# Patient Record
Sex: Male | Born: 1990 | Race: White | Hispanic: No | Marital: Single | Smoking: Current every day smoker
Health system: Southern US, Community
[De-identification: ages and names within clinical notes are randomized; demographics above are authoritative.]

## PROBLEM LIST (undated history)

## (undated) DIAGNOSIS — F419 Anxiety disorder, unspecified: Secondary | ICD-10-CM

## (undated) DIAGNOSIS — F319 Bipolar disorder, unspecified: Secondary | ICD-10-CM

---
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
## 2019-04-02 ENCOUNTER — Emergency Department (HOSPITAL_COMMUNITY)
Admission: EM | Admit: 2019-04-02 | Discharge: 2019-04-02 | Disposition: A | Discharge status: Home / Self Care | Payer: Medicaid Other | Attending: Emergency Medicine | Admitting: Emergency Medicine

## 2019-04-02 ENCOUNTER — Encounter (HOSPITAL_COMMUNITY): Payer: Self-pay | Admitting: *Deleted

## 2019-04-02 ENCOUNTER — Other Ambulatory Visit: Payer: Self-pay

## 2019-04-02 DIAGNOSIS — M5431 Sciatica, right side: Secondary | ICD-10-CM | POA: Diagnosis not present

## 2019-04-02 DIAGNOSIS — M549 Dorsalgia, unspecified: Secondary | ICD-10-CM | POA: Diagnosis present

## 2019-04-02 MED ORDER — KETOROLAC TROMETHAMINE 30 MG/ML IJ SOLN
30.0000 mg | Freq: Once | INTRAMUSCULAR | Status: AC
  Administered 2019-04-02: 30 mg via INTRAMUSCULAR
  Filled 2019-04-02: qty 1

## 2019-04-02 NOTE — ED Provider Notes (Signed)
[facility] DEPT Provider Note   CSN: [id number] Arrival date & time: 04/02/19  0231     History   Chief Complaint Chief Complaint  Patient presents with  . Back Pain    HPI [name] [name] is a 28 y.o. male.     Patient presents to the emergency department with a chief complaint of right-sided low back pain.  He has a history of sciatica.  States that the pain radiates to his right leg.  Denies numbness or tingling.  Denies bowel or bladder incontinence.  Denies any fevers chills.  States that he thinks he exacerbated the symptoms by lifting tonight at work.  The history is provided by the patient. No language interpreter was used.    History reviewed. No pertinent past medical history.  There are no active problems to display for this patient.   History reviewed. No pertinent surgical history.      Home Medications    Prior to Admission medications   Not on File    Family History No family history on file.  Social History Social History   Tobacco Use  . Smoking status: Not on file  Substance Use Topics  . Alcohol use: Not on file  . Drug use: Not on file     Allergies   Patient has no known allergies.   Review of Systems Review of Systems  All other systems reviewed and are negative.    Physical Exam Updated Vital Signs BP (!) 155/94 (BP Location: Left Arm)   Pulse 89   Temp 98.2 F (36.8 C) (Oral)   Resp 17   Ht 6\' 1"  (1.854 m)   Wt 122.5 kg   SpO2 96%   BMI 35.62 kg/m   Physical Exam Physical Exam  Constitutional: Pt appears well-developed and well-nourished. No distress.  HENT:  Head: Normocephalic and atraumatic.  Mouth/Throat: Oropharynx is clear and moist. No oropharyngeal exudate.  Eyes: Conjunctivae are normal.  Neck: Normal range of motion. Neck supple.  No meningismus Cardiovascular: Normal rate, regular rhythm and intact distal pulses.   Pulmonary/Chest: Effort normal and breath sounds  normal. No respiratory distress. Pt has no wheezes.  Abdominal: Pt exhibits no distension Musculoskeletal:   Right lumbar paraspinal muscles tender to palpation, no bony CTLS spine tenderness, deformity, step-off, or crepitus Lymphadenopathy: Pt has no cervical adenopathy.  Neurological: Pt is alert and oriented Speech is clear and goal oriented, follows commands Normal 5/5 strength in lower extremities bilaterally including dorsiflexion and plantar flexion Sensation intact Great toe extension intact Moves extremities without ataxia, coordination intact Ankle and knee jerk reflexes intact and symmetrical  Normal gait Normal balance No Clonus Skin: Skin is warm and dry. No rash noted. Pt is not diaphoretic. No erythema.  Psychiatric: Pt has a normal mood and affect. Behavior is normal.  Nursing note and vitals reviewed.   ED Treatments / Results  Labs (all labs ordered are listed, but only abnormal results are displayed) Labs Reviewed - No data to display  EKG None  Radiology No results found.  Procedures Procedures (including critical care time)  Medications Ordered in ED Medications  ketorolac (TORADOL) 30 MG/ML injection 30 mg (has no administration in time range)     Initial Impression / Assessment and Plan / ED Course  I have reviewed the triage vital signs and the nursing notes.  Pertinent labs & imaging results that were available during my care of the patient were reviewed by me and considered in my  medical decision making (see chart for details).        Patient with back pain.    No neurological deficits and normal neuro exam.  Patient is ambulatory.  No loss of bowel or bladder control.  Doubt cauda equina.  Denies fever,  doubt epidural abscess or other lesion. Recommend back exercises, stretching, RICE, and will treat with toradol IM.  Consultants: none    Encouraged the patient that there could be a need for additional workup and/or imaging such as  MRI, if the symptoms do not resolve. Patient advised that if the back pain does not resolve, or radiates, this could progress to more serious conditions and is encouraged to follow-up with PCP or orthopedics within 2 weeks.     Final Clinical Impressions(s) / ED Diagnoses   Final diagnoses:  Sciatica of right side    ED Discharge Orders    None       Roxy Horseman, PA-C 04/02/19 0257    Palumbo, April, MD 04/02/19 2993

## 2019-04-02 NOTE — ED Triage Notes (Signed)
Right lower back pain for about 30 minutes, feel like sciatica. Pain radiates down leg.

## 2019-04-03 ENCOUNTER — Encounter (HOSPITAL_COMMUNITY): Payer: Self-pay

## 2019-04-03 ENCOUNTER — Emergency Department (HOSPITAL_COMMUNITY)
Admission: EM | Admit: 2019-04-03 | Discharge: 2019-04-03 | Disposition: A | Discharge status: Home / Self Care | Payer: Medicaid Other | Attending: Emergency Medicine | Admitting: Emergency Medicine

## 2019-04-03 ENCOUNTER — Other Ambulatory Visit: Payer: Self-pay

## 2019-04-03 DIAGNOSIS — M5441 Lumbago with sciatica, right side: Secondary | ICD-10-CM | POA: Insufficient documentation

## 2019-04-03 DIAGNOSIS — M545 Low back pain: Secondary | ICD-10-CM | POA: Diagnosis present

## 2019-04-03 MED ORDER — NAPROXEN 500 MG PO TABS: 500.0000 mg | ORAL_TABLET | Freq: Two times a day (BID) | ORAL | 0 refills | Status: DC

## 2019-04-03 NOTE — ED Provider Notes (Signed)
[facility] DEPT Provider Note   CSN: [id number] Arrival date & time: 04/03/19  1327     History   Chief Complaint Chief Complaint  Patient presents with  . Back Pain    HPI [name] [name] is a 28 y.o. male.     Patient presents the emergency department for ongoing back pain.  Patient was seen in the emergency department yesterday for the same symptoms.  Initially started several days ago after lifting a heavy box.  He was given a shot of Toradol in the ED.  Patient is currently in a rehab facility for substance abuse.  He has been on gabapentin in the past but is not currently allowed to take this for his sciatica.  Patient today is mainly requesting a note for work.  He would like to do warm Epson salt soaks tonight but needs a note or else he needs to go to work. Patient denies warning symptoms of back pain including: fecal incontinence, urinary retention or overflow incontinence, night sweats, waking from sleep with back pain, unexplained fevers or weight loss, h/o cancer, recent IVDU, recent trauma.  No weakness, numbness, tingling in lower extremities.  Not currently taking any medications for this.      History reviewed. No pertinent past medical history.  There are no active problems to display for this patient.   History reviewed. No pertinent surgical history.      Home Medications    Prior to Admission medications   Medication Sig Start Date End Date Taking? Authorizing Provider  naproxen (NAPROSYN) 500 MG tablet Take 1 tablet (500 mg total) by mouth 2 (two) times daily. 04/03/19   Carlisle Cater, PA-C    Family History History reviewed. No pertinent family history.  Social History Social History   Tobacco Use  . Smoking status: Not on file  Substance Use Topics  . Alcohol use: Not on file  . Drug use: Not on file     Allergies   Patient has no known allergies.   Review of Systems Review of Systems  Constitutional:  Negative for fever and unexpected weight change.  Gastrointestinal: Negative for constipation.       Neg for fecal incontinence  Genitourinary: Negative for difficulty urinating, flank pain and hematuria.       Negative for urinary incontinence or retention  Musculoskeletal: Positive for back pain.  Neurological: Negative for weakness and numbness.       Negative for saddle paresthesias      Physical Exam Updated Vital Signs BP (!) 154/89 (BP Location: Right Arm)   Pulse 77   Temp 98.9 F (37.2 C) (Oral)   Resp 16   Ht 6\' 1"  (1.854 m)   Wt 122 kg   SpO2 98%   BMI 35.49 kg/m   Physical Exam Vitals signs and nursing note reviewed.  Constitutional:      Appearance: He is well-developed.  HENT:     Head: Normocephalic and atraumatic.  Eyes:     Conjunctiva/sclera: Conjunctivae normal.  Neck:     Musculoskeletal: Normal range of motion.  Abdominal:     Palpations: Abdomen is soft.     Tenderness: There is no abdominal tenderness.  Musculoskeletal: Normal range of motion.     Lumbar back: He exhibits tenderness. He exhibits normal range of motion and no bony tenderness.       Back:     Comments: No step-off noted with palpation of spine.   Skin:  General: Skin is warm and dry.  Neurological:     Mental Status: He is alert.     Sensory: No sensory deficit.     Motor: No abnormal muscle tone.     Deep Tendon Reflexes: Reflexes are normal and symmetric.     Comments: 5/5 strength in entire lower extremities bilaterally. No sensation deficit.       ED Treatments / Results  Labs (all labs ordered are listed, but only abnormal results are displayed) Labs Reviewed - No data to display  EKG None  Radiology No results found.  Procedures Procedures (including critical care time)  Medications Ordered in ED Medications - No data to display   Initial Impression / Assessment and Plan / ED Course  I have reviewed the triage vital signs and the nursing notes.   Pertinent labs & imaging results that were available during my care of the patient were reviewed by me and considered in my medical decision making (see chart for details).        Patient seen and examined. Rx for naproxen. Pt given work note to rest tonight.   Vital signs reviewed and are as follows: BP (!) 154/89 (BP Location: Right Arm)   Pulse 77   Temp 98.9 F (37.2 C) (Oral)   Resp 16   Ht 6\' 1"  (1.854 m)   Wt 122 kg   SpO2 98%   BMI 35.49 kg/m   No red flag s/s of low back pain. Patient was counseled on back pain precautions and told to do activity as tolerated but do not lift, push, or pull heavy objects more than 10 pounds for the next week.  Patient counseled to use ice or heat on back for no longer than 15 minutes every hour.   Patient urged to follow-up with PCP if pain does not improve with treatment and rest or if pain becomes recurrent. Urged to return with worsening severe pain, loss of bowel or bladder control, trouble walking.   The patient verbalizes understanding and agrees with the plan.   Final Clinical Impressions(s) / ED Diagnoses   Final diagnoses:  Acute right-sided low back pain with right-sided sciatica   Patient with back pain with radicular features, similar to previous episodes. No neurological deficits. Patient is ambulatory. No warning symptoms of back pain including: fecal incontinence, urinary retention or overflow incontinence, night sweats, waking from sleep with back pain, unexplained fevers or weight loss, h/o cancer, IVDU, recent trauma. No concern for cauda equina, epidural abscess, or other serious cause of back pain. Conservative measures such as rest, ice/heat and pain medicine indicated with PCP follow-up if no improvement with conservative management.    ED Discharge Orders         Ordered    naproxen (NAPROSYN) 500 MG tablet  2 times daily     04/03/19 1401           Jereme, Loren, PA-C 04/03/19 1407    13/06/20, MD 04/03/19 1654

## 2019-04-03 NOTE — Discharge Instructions (Signed)
Please read and follow all provided instructions.  Your diagnoses today include:  1. Acute right-sided low back pain with right-sided sciatica     Tests performed today include:  Vital signs - see below for your results today  Medications prescribed:   Naproxen - anti-inflammatory pain medication  Do not exceed 500mg  naproxen every 12 hours, take with food  You have been prescribed an anti-inflammatory medication or NSAID. Take with food. Take smallest effective dose for the shortest duration needed for your pain. Stop taking if you experience stomach pain or vomiting.   Take any prescribed medications only as directed.  Home care instructions:   Follow any educational materials contained in this packet  Please rest, use ice or heat on your back for the next several days  Do not lift, push, pull anything more than 10 pounds for the next week  Follow-up instructions: Please follow-up with your primary care provider in the next 1 week for further evaluation of your symptoms.   Return instructions:  SEEK IMMEDIATE MEDICAL ATTENTION IF YOU HAVE:  New numbness, tingling, weakness, or problem with the use of your arms or legs  Severe back pain not relieved with medications  Loss control of your bowels or bladder  Increasing pain in any areas of the body (such as chest or abdominal pain)  Shortness of breath, dizziness, or fainting.   Worsening nausea (feeling sick to your stomach), vomiting, fever, or sweats  Any other emergent concerns regarding your health   Additional Information:  Your vital signs today were: BP (!) 154/89 (BP Location: Right Arm)    Pulse 77    Temp 98.9 F (37.2 C) (Oral)    Resp 16    Ht 6\' 1"  (1.854 m)    Wt 122 kg    SpO2 98%    BMI 35.49 kg/m  If your blood pressure (BP) was elevated above 135/85 this visit, please have this repeated by your doctor within one month. --------------

## 2019-04-03 NOTE — ED Triage Notes (Signed)
Pt reports sciatica back pain that radiates to right leg. Pt states that he is going to a rehab facility for cocaine and they will not allow him to take his normal prescription of gabapentin.

## 2019-04-17 ENCOUNTER — Other Ambulatory Visit: Payer: Self-pay | Admitting: Family Medicine

## 2019-04-17 DIAGNOSIS — M545 Low back pain, unspecified: Secondary | ICD-10-CM

## 2019-04-21 ENCOUNTER — Other Ambulatory Visit: Payer: Self-pay

## 2019-04-21 ENCOUNTER — Ambulatory Visit: Payer: Medicaid Other | Attending: Family Medicine | Admitting: Physical Therapy

## 2019-04-21 ENCOUNTER — Encounter: Payer: Self-pay | Admitting: Physical Therapy

## 2019-04-21 DIAGNOSIS — M79651 Pain in right thigh: Secondary | ICD-10-CM

## 2019-04-21 DIAGNOSIS — M5416 Radiculopathy, lumbar region: Secondary | ICD-10-CM | POA: Insufficient documentation

## 2019-04-21 NOTE — Therapy (Signed)
[facility] [address] [place], [place], [address] Phone: [phone]   Fax:  [phone]  Physical Therapy Evaluation  Patient Details  Name: [name] [name] MRN: [id number] Date of Birth: 10-17-1990 Referring Provider (PT): Otho Darner, MD   Encounter Date: 04/21/2019  PT End of Session - 04/21/19 1147    Visit Number  1    Date for PT Re-Evaluation  05/15/19    Authorization Type  MCD- auth submitted 11/24    PT Start Time  1100    PT Stop Time  1136    PT Time Calculation (min)  36 min    Activity Tolerance  Patient limited by pain    Behavior During Therapy  [facility] for tasks assessed/performed       History reviewed. No pertinent past medical history.  History reviewed. No pertinent surgical history.  There were no vitals filed for this visit.   Subjective Assessment - 04/21/19 1104    Subjective  about 3 years ago I think I lifted something wrong & pain has gradually worsened. [place] work [place] and it is a lot of heavy lifting. Sharp pain that limits walking. Starts in back and goes down Rt leg. Denies regular exercise.    How long can you stand comfortably?  a couple of hours    Currently in Pain?  Yes    Pain Score  7     Pain Location  Back    Pain Orientation  Right    Pain Descriptors / Indicators  Sharp    Pain Radiating Towards  Rt Le    Aggravating Factors   standing    Pain Relieving Factors  sit down         [facility] PT Assessment - 04/21/19 0001      Assessment   Medical Diagnosis  LBP    Referring Provider (PT)  Otho Darner, MD    Onset Date/Surgical Date  --   3 yr ago   Hand Dominance  Right      Precautions   Precautions  None      Restrictions   Weight Bearing Restrictions  No      Balance Screen   Has the patient fallen in the past 6 months  No      Home Environment   Living Environment  Private residence    Additional Comments  a couple of steps      Prior  Function   Level of Independence  Independent    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      [facility] Adult PT Treatment/Exercise - 04/21/19 0001  Exercises   Exercises  Knee/Hip      Knee/Hip Exercises: Stretches   Passive Hamstring Stretch Limitations  seated EOB      Manual Therapy   Manual Therapy  Soft tissue mobilization    Manual therapy comments  LLE LAD, edu on self STM with tennis ball    Soft tissue mobilization  trigger point release Rt QL & glut max             PT Education - 04/21/19 1709    Education Details  anatomy of condition, POC, HEP, exercise form/raitonale    Person(s) Educated  Patient    Methods  Explanation;Demonstration;Tactile cues;Verbal cues;Handout    Comprehension  Verbalized understanding;Returned demonstration;Verbal cues required;Need further instruction;Tactile cues required       PT Short Term Goals - 04/21/19 1714      PT SHORT TERM GOAL #1   Title  pt will perform bed mobility wiht <=moderate pain    Baseline  severe at eval    Time  3    Period  Weeks    Status  New    Target Date  05/15/19      PT SHORT TERM GOAL #2   Title  pt will demo at least 25% available lumbar AROM    Baseline  approx 10% at eval    Time  3    Period  Weeks    Status  New    Target Date  05/15/19      PT SHORT TERM GOAL #3   Title  pt will  be able to demo proper seated posture    Baseline  slouching at eval due to pain    Time  3    Period  Weeks    Status  New    Target Date  05/15/19        PT Long Term Goals - 04/21/19 1720      PT LONG TERM GOAL #1   Title  to be set at ERO PRN             Plan - 04/21/19 1156    Clinical Impression Statement  Pt presents to PT with complaints of Rt low back and Rt leg pain that began about 3 years ago. Pt unable to place weight through RLE in standing. In supine, approx 3" LLD (Lt shorder) apparent and decreased to approx 1/2" apparent discrepancy following LAD. pt has a very difficult time moving so exercises were difficult. He also sits in a sacral-sitting position and was cued to sit upright but reported increased pain. concordant pain in LE created with trigger point palpation so we discussed likely use of TPDN at next visit. Pt will benefit from skilled PT in order to reduce muscle spasm to allow for proper alignment and mechanical chain movement.    Personal Factors and Comorbidities  Comorbidity 1;Fitness    Comorbidities  job requiring heavy lifting    Examination-Activity Limitations  Bathing;Locomotion Level;Transfers;Bed Mobility;Bend;Sit;Carry;Sleep;Squat;Dressing;Stairs;Stand;Lift    Examination-Participation Restrictions  Meal Prep;Cleaning    Stability/Clinical Decision Making  Evolving/Moderate complexity    Clinical Decision Making  Moderate    Rehab Potential  Good    PT Duration  --   3 visits in first auth   PT Treatment/Interventions  ADLs/Self Care Home Management;Cryotherapy;Electrical Stimulation;Ultrasound;Traction;Moist Heat;Iontophoresis 4mg /ml Dexamethasone;Gait training;Stair training;Functional mobility training;Therapeutic activities;Therapeutic exercise;Balance training;Patient/family education;Neuromuscular re-education;Manual techniques;Passive range of motion;Dry needling;Spinal Manipulations;Taping;Joint Manipulations    PT Next Visit Plan   continue LAD, TPDN  PT Home Exercise Plan  LTR, piriformis stretch, seated HSS    Consulted and Agree with Plan of Care  Patient       Patient will benefit from skilled therapeutic intervention in order to improve the following deficits and impairments:  Difficulty walking, Increased muscle spasms, Decreased activity tolerance, Pain, Improper body mechanics, Impaired flexibility, Decreased strength, Decreased mobility, Postural dysfunction  Visit Diagnosis: Radiculopathy, lumbar region - Plan: PT plan of care cert/re-cert  Pain of right thigh - Plan: PT plan of care cert/re-cert     Problem List There are no active problems to display for this patient.   Betha Shadix C. Seema Blum PT, DPT 04/21/19 5:23 PM   [facility] [address] [place], [place], [address] Phone: [phone]   Fax:  [phone]  Name: Careem Yasui MRN: [id number] Date of Birth: [birth year]

## 2019-04-26 ENCOUNTER — Encounter (HOSPITAL_COMMUNITY): Payer: Self-pay

## 2019-04-26 ENCOUNTER — Inpatient Hospital Stay (HOSPITAL_COMMUNITY)
Admission: AD | Admit: 2019-04-26 | Discharge: 2019-05-01 | DRG: 885 | Disposition: A | Discharge status: Home / Self Care | Payer: Medicaid Other | Source: Intra-hospital | Attending: Psychiatry | Admitting: Psychiatry

## 2019-04-26 ENCOUNTER — Other Ambulatory Visit: Payer: Self-pay

## 2019-04-26 ENCOUNTER — Emergency Department (HOSPITAL_COMMUNITY)
Admission: EM | Admit: 2019-04-26 | Discharge: 2019-04-26 | Disposition: A | Discharge status: Other Acute Inpatient Hospital | Payer: Medicaid Other | Attending: Emergency Medicine | Admitting: Emergency Medicine

## 2019-04-26 DIAGNOSIS — F17210 Nicotine dependence, cigarettes, uncomplicated: Secondary | ICD-10-CM | POA: Diagnosis present

## 2019-04-26 DIAGNOSIS — Z79899 Other long term (current) drug therapy: Secondary | ICD-10-CM | POA: Insufficient documentation

## 2019-04-26 DIAGNOSIS — F319 Bipolar disorder, unspecified: Secondary | ICD-10-CM | POA: Diagnosis present

## 2019-04-26 DIAGNOSIS — F332 Major depressive disorder, recurrent severe without psychotic features: Secondary | ICD-10-CM | POA: Diagnosis not present

## 2019-04-26 DIAGNOSIS — F3181 Bipolar II disorder: Secondary | ICD-10-CM

## 2019-04-26 DIAGNOSIS — F1220 Cannabis dependence, uncomplicated: Secondary | ICD-10-CM | POA: Diagnosis present

## 2019-04-26 DIAGNOSIS — F1994 Other psychoactive substance use, unspecified with psychoactive substance-induced mood disorder: Secondary | ICD-10-CM | POA: Diagnosis present

## 2019-04-26 DIAGNOSIS — R45851 Suicidal ideations: Secondary | ICD-10-CM | POA: Insufficient documentation

## 2019-04-26 DIAGNOSIS — Z008 Encounter for other general examination: Secondary | ICD-10-CM | POA: Diagnosis not present

## 2019-04-26 DIAGNOSIS — Z9119 Patient's noncompliance with other medical treatment and regimen: Secondary | ICD-10-CM

## 2019-04-26 DIAGNOSIS — F329 Major depressive disorder, single episode, unspecified: Secondary | ICD-10-CM | POA: Diagnosis present

## 2019-04-26 DIAGNOSIS — Z20828 Contact with and (suspected) exposure to other viral communicable diseases: Secondary | ICD-10-CM | POA: Diagnosis not present

## 2019-04-26 LAB — COMPREHENSIVE METABOLIC PANEL
ALT: 13 U/L (ref 0–44)
AST: 17 U/L (ref 15–41)
Albumin: 3.9 g/dL (ref 3.5–5.0)
Alkaline Phosphatase: 43 U/L (ref 38–126)
Anion gap: 9 (ref 5–15)
BUN: 18 mg/dL (ref 6–20)
CO2: 24 mmol/L (ref 22–32)
Calcium: 8.9 mg/dL (ref 8.9–10.3)
Chloride: 105 mmol/L (ref 98–111)
Creatinine, Ser: 1.05 mg/dL (ref 0.61–1.24)
GFR calc Af Amer: >60 mL/min (ref >60)
GFR calc non Af Amer: >60 mL/min (ref >60)
Glucose, Bld: 141 mg/dL — ABNORMAL HIGH (ref 70–99)
Potassium: 3.6 mmol/L (ref 3.5–5.1)
Sodium: 138 mmol/L (ref 135–145)
Total Bilirubin: 0.4 mg/dL (ref 0.3–1.2)
Total Protein: 7.3 g/dL (ref 6.5–8.1)

## 2019-04-26 LAB — SALICYLATE LEVEL: Salicylate Lvl: <7 mg/dL (ref 2.8–30.0)

## 2019-04-26 LAB — RAPID URINE DRUG SCREEN, HOSP PERFORMED
Amphetamines: NOT DETECTED
Barbiturates: NOT DETECTED
Benzodiazepines: NOT DETECTED
Cocaine: NOT DETECTED
Opiates: NOT DETECTED
Tetrahydrocannabinol: POSITIVE — AB

## 2019-04-26 LAB — LITHIUM LEVEL: Lithium Lvl: 0.27 mmol/L — ABNORMAL LOW (ref 0.60–1.20)

## 2019-04-26 LAB — CBC
HCT: 45.3 % (ref 39.0–52.0)
Hemoglobin: 14.4 g/dL (ref 13.0–17.0)
MCH: 30.1 pg (ref 26.0–34.0)
MCHC: 31.8 g/dL (ref 30.0–36.0)
MCV: 94.6 fL (ref 80.0–100.0)
Platelets: 252 10*3/uL (ref 150–400)
RBC: 4.79 MIL/uL (ref 4.22–5.81)
RDW: 12.8 % (ref 11.5–15.5)
WBC: 16.8 10*3/uL — ABNORMAL HIGH (ref 4.0–10.5)
nRBC: 0 % (ref 0.0–0.2)

## 2019-04-26 LAB — ACETAMINOPHEN LEVEL: Acetaminophen (Tylenol), Serum: <10 ug/mL — ABNORMAL LOW (ref 10–30)

## 2019-04-26 LAB — ETHANOL: Alcohol, Ethyl (B): <10 mg/dL (ref <10)

## 2019-04-26 LAB — SARS CORONAVIRUS 2 BY RT PCR (HOSPITAL ORDER, PERFORMED IN ~~LOC~~ HOSPITAL LAB): SARS Coronavirus 2: NEGATIVE

## 2019-04-26 MED ORDER — NICOTINE 21 MG/24HR TD PT24
21.0000 mg | MEDICATED_PATCH | Freq: Once | TRANSDERMAL | Status: AC
  Administered 2019-04-26: 21 mg via TRANSDERMAL
  Filled 2019-04-26: qty 1

## 2019-04-26 MED ORDER — LITHIUM CARBONATE ER 300 MG PO TBCR
300.0000 mg | EXTENDED_RELEASE_TABLET | Freq: Two times a day (BID) | ORAL | Status: DC
  Administered 2019-04-26: 300 mg via ORAL
  Filled 2019-04-26: qty 1

## 2019-04-26 MED ORDER — LITHIUM CARBONATE 300 MG PO CAPS: 600.0000 mg | ORAL_CAPSULE | Freq: Two times a day (BID) | ORAL | Status: DC

## 2019-04-26 MED ORDER — TRAZODONE HCL 100 MG PO TABS
100.0000 mg | ORAL_TABLET | Freq: Every evening | ORAL | Status: DC | PRN
  Administered 2019-04-26: 100 mg via ORAL
  Filled 2019-04-26: qty 1

## 2019-04-26 MED ORDER — NICOTINE 21 MG/24HR TD PT24
21.0000 mg | MEDICATED_PATCH | Freq: Once | TRANSDERMAL | Status: DC
  Administered 2019-04-26: 21 mg via TRANSDERMAL
  Filled 2019-04-26: qty 1

## 2019-04-26 MED ORDER — NAPROXEN 500 MG PO TABS
500.0000 mg | ORAL_TABLET | Freq: Two times a day (BID) | ORAL | Status: DC
  Administered 2019-04-28: 500 mg via ORAL
  Filled 2019-04-26 (×8): qty 1

## 2019-04-26 MED ORDER — ACETAMINOPHEN 325 MG PO TABS
650.0000 mg | ORAL_TABLET | Freq: Four times a day (QID) | ORAL | Status: DC | PRN
  Administered 2019-04-28: 650 mg via ORAL
  Filled 2019-04-26: qty 2

## 2019-04-26 MED ORDER — LITHIUM CARBONATE 300 MG PO CAPS
750.0000 mg | ORAL_CAPSULE | Freq: Two times a day (BID) | ORAL | Status: DC
  Administered 2019-04-27: 750 mg via ORAL
  Filled 2019-04-26 (×4): qty 1

## 2019-04-26 MED ORDER — HYDROXYZINE HCL 25 MG PO TABS
25.0000 mg | ORAL_TABLET | Freq: Three times a day (TID) | ORAL | Status: DC | PRN
  Administered 2019-04-26 – 2019-04-27 (×3): 25 mg via ORAL
  Filled 2019-04-26 (×3): qty 1

## 2019-04-26 MED ORDER — MAGNESIUM HYDROXIDE 400 MG/5ML PO SUSP: 30.0000 mL | Freq: Every day | ORAL | Status: DC | PRN

## 2019-04-26 MED ORDER — LITHIUM CARBONATE 300 MG PO CAPS
750.0000 mg | ORAL_CAPSULE | Freq: Once | ORAL | Status: AC
  Administered 2019-04-26: 750 mg via ORAL
  Filled 2019-04-26: qty 1

## 2019-04-26 MED ORDER — ALUM & MAG HYDROXIDE-SIMETH 200-200-20 MG/5ML PO SUSP: 30.0000 mL | ORAL | Status: DC | PRN

## 2019-04-26 NOTE — ED Triage Notes (Signed)
Pt reports SI. He states that he was released from [facility] about a month ago. Hx of cocaine abuse. Denies a current plan, but states that he has cut himself in the past.

## 2019-04-26 NOTE — ED Notes (Signed)
Pt moved to a room for privacy, TTS consult in progress.

## 2019-04-26 NOTE — ED Notes (Signed)
Pt back to bed in [place] [facility].

## 2019-04-26 NOTE — ED Notes (Signed)
Patient has ambulated to restroom without complication or assistance from staff.

## 2019-04-26 NOTE — Progress Notes (Signed)
04/26/2019  [place] To give report. Left name and number for RN to return call. Waiting for RN to call back in 30 minutes.

## 2019-04-26 NOTE — ED Provider Notes (Signed)
[facility] DEPT Provider Note   CSN: [id number] Arrival date & time: 04/26/19  0005     History   Chief Complaint Chief Complaint  Patient presents with  . Medical Clearance    HPI [name] [name] is a 28 y.o. male.     Patient to ED with suicidal ideations and depression. History of same, last hospitalization one month ago at [facility]. No self harm prior to arrival. No recent fever, cough, congestion, vomiting, abdominal pain. History of substance abuse.  The history is provided by the patient. No language interpreter was used.    History reviewed. No pertinent past medical history.  There are no active problems to display for this patient.   History reviewed. No pertinent surgical history.      Home Medications    Prior to Admission medications   Medication Sig Start Date End Date Taking? Authorizing Provider  lithium 600 MG capsule Take 600 mg by mouth 2 (two) times daily with a meal.    [provider]  naproxen (NAPROSYN) 500 MG tablet Take 1 tablet (500 mg total) by mouth 2 (two) times daily. 04/03/19   Carlisle Cater, PA-C    Family History History reviewed. No pertinent family history.  Social History Social History   Tobacco Use  . Smoking status: Not on file  Substance Use Topics  . Alcohol use: Not on file  . Drug use: Not on file     Allergies   Patient has no known allergies.   Review of Systems Review of Systems  Constitutional: Negative for chills and fever.  HENT: Negative.   Respiratory: Negative.   Cardiovascular: Negative.   Gastrointestinal: Negative.   Musculoskeletal: Negative.   Skin: Negative.   Neurological: Negative.   Psychiatric/Behavioral: Positive for dysphoric mood and suicidal ideas. Negative for self-injury.     Physical Exam Updated Vital Signs BP (!) 145/89 (BP Location: Left Arm)   Pulse (!) 112   Temp 98.8 F (37.1 C) (Oral)   Resp (!) 22   SpO2 99%    Physical Exam Vitals signs and nursing note reviewed.  Constitutional:      Appearance: He is well-developed.  HENT:     Head: Normocephalic.  Neck:     Musculoskeletal: Normal range of motion and neck supple.  Cardiovascular:     Rate and Rhythm: Normal rate and regular rhythm.     Heart sounds: No murmur.  Pulmonary:     Effort: Pulmonary effort is normal.     Breath sounds: Normal breath sounds. No wheezing, rhonchi or rales.  Abdominal:     General: Bowel sounds are normal.     Palpations: Abdomen is soft.     Tenderness: There is no abdominal tenderness. There is no guarding or rebound.  Musculoskeletal: Normal range of motion.  Skin:    General: Skin is warm and dry.     Findings: No rash.  Neurological:     Mental Status: He is alert and oriented to person, place, and time.  Psychiatric:        Attention and Perception: He does not perceive auditory or visual hallucinations.        Mood and Affect: Mood is depressed.        Thought Content: Thought content includes suicidal ideation.      ED Treatments / Results  Labs (all labs ordered are listed, but only abnormal results are displayed) Labs Reviewed  COMPREHENSIVE METABOLIC PANEL - Abnormal; Notable for  the following components:      Result Value   Glucose, Bld 141 (*)    All other components within normal limits  ACETAMINOPHEN LEVEL - Abnormal; Notable for the following components:   Acetaminophen (Tylenol), Serum <10 (*)    All other components within normal limits  CBC - Abnormal; Notable for the following components:   WBC 16.8 (*)    All other components within normal limits  RAPID URINE DRUG SCREEN, HOSP PERFORMED - Abnormal; Notable for the following components:   Tetrahydrocannabinol POSITIVE (*)    All other components within normal limits  LITHIUM LEVEL - Abnormal; Notable for the following components:   Lithium Lvl 0.27 (*)    All other components within normal limits  ETHANOL  SALICYLATE  LEVEL    EKG None  Radiology No results found.  Procedures Procedures (including critical care time)  Medications Ordered in ED Medications - No data to display   Initial Impression / Assessment and Plan / ED Course  I have reviewed the triage vital signs and the nursing notes.  Pertinent labs & imaging results that were available during my care of the patient were reviewed by me and considered in my medical decision making (see chart for details).        Patient to ED with SI, no reported self injury PTA. History of same.   Labs reviewed. He is considered medically cleared for TTS consultation to assist in appropriate disposition.   Per TTS assessment, patient to be observed overnight with psychiatric reassessment in the morning.  Final Clinical Impressions(s) / ED Diagnoses   Final diagnoses:  None   1. Suicidal ideation 2. Depression   ED Discharge Orders    None       Elpidio Anis, Cordelia Poche 04/26/19 0715    Molpus, Jonny Ruiz, MD 04/26/19 [phone]

## 2019-04-26 NOTE — Tx Team (Signed)
Initial Treatment Plan 04/26/2019 7:45 PM [name] VOP:[id number]    PATIENT STRESSORS: Loss of father Medication change or noncompliance Other: Suicidal ideation   PATIENT STRENGTHS: Average or above average intelligence Capable of independent living Communication skills Motivation for treatment/growth Physical Health   PATIENT IDENTIFIED PROBLEMS:      "suicidal ideation"     "meds not working"     "loss of father in January 2020"         DISCHARGE CRITERIA:  Adequate post-discharge living arrangements Improved stabilization in mood, thinking, and/or behavior Reduction of life-threatening or endangering symptoms to within safe limits  PRELIMINARY DISCHARGE PLAN: Attend 12-step recovery group Outpatient therapy Return to previous living arrangement  PATIENT/FAMILY INVOLVEMENT: This treatment plan has been presented to and reviewed with the patient, [name] [name]  The patient has been given the opportunity to ask questions and make suggestions.  Waymond Cera, RN 04/26/2019, 7:45 PM

## 2019-04-26 NOTE — Progress Notes (Signed)
04/26/2019  1447  Report given to Mateo Flow at [facility].

## 2019-04-26 NOTE — ED Notes (Signed)
Sitter at bedside with patient.

## 2019-04-26 NOTE — Progress Notes (Signed)
Per Verne Grain, pt has been accepted to [facility]. Bed number is 307-2. Accepting provider is  Sheran Fava, NP. Attending provider will be Dr. Parke Poisson. Number for report is [phone]. The pt may arrive pending negative COViD results.   Darletta Moll MSW, [place] Worker Disposition  [facility] Ph: [phone] Fax: [phone]  04/26/2019 12:33 PM

## 2019-04-26 NOTE — BH Assessment (Signed)
Tele Assessment Note   Patient Name: [name] [name] MRN: [id number] Referring Physician: Dr. Lacretia Leigh, MD Location of Patient: [facility] Location of Provider: [place] Department  [name] [name] is a 28 y.o. male who came to [facility] voluntarily due to ongoing [name]. Pt shares he has a hx of MDD and Bipolar Disorder and that he has been "in and out of hospitals since 2009/05/2007." Pt shares his father died in 2018-06-28 and that he has been experiencing SI for the past 4-5 months. He states he was hospitalized at [facility] for 2 weeks approximately 1 month ago. He shares that, while hospitalized, he was put on Lithium 600mg  2x/day.  Pt confirms thoughts of SI; he states he has attempted to kill himself 20-30 times by cutting, hanging, etc. He denies HI, AVH, NSSIB, access to guns/weapons, engagemet in the legal system, or SA. Pt then stated he has been living in [facility] for 2 months, so clinician again inquired about SA and pt acknowledged he has a prior hx of abusing marijuana, though denied all other substances.   Pt declined to provide verbal consent for clinician to contact a friend or family member for collateral information.  Pt is oriented x4. His recent and remote memory is intact. Pt was cooperative, though somewhat invasive regarding information, throughout the assessment process. Pt's insight, judgement, and impulse control is fair at this time.   Diagnosis: F33.2, Major depressive disorder, Recurrent episode, Severe   Past Medical History: History reviewed. No pertinent past medical history.  History reviewed. No pertinent surgical history.  Family History: History reviewed. No pertinent family history.  Social History:  has no history on file for tobacco, alcohol, and drug.  Additional Social History:  Alcohol / Drug Use Pain Medications: Please see MAR Prescriptions: Please see MAR Over the Counter: Please see MAR History of alcohol / drug use?:  Yes Longest period of sobriety (when/how long): Unknown Substance #1 Name of Substance 1: Marijuana 1 - Age of First Use: Unknown 1 - Amount (size/oz): Unknown 1 - Frequency: Unknown 1 - Duration: Unknown 1 - Last Use / Amount: Unknown; pt has been living in sober living for 2 months  CIWA: CIWA-Ar BP: (!) 145/89 Pulse Rate: (!) 112 COWS:    Allergies: No Known Allergies  Home Medications: (Not in a hospital admission)   OB/GYN Status:  No LMP for male patient.  General Assessment Data Assessment unable to be completed: Yes Reason for not completing assessment: No answer on Tele-Assessment machine Location of Assessment: [facility] TTS Assessment: In system Is this a Tele or Face-to-Face Assessment?: Tele Assessment Is this an Initial Assessment or a Re-assessment for this encounter?: Initial Assessment Patient Accompanied by:: N/A Language Other than English: No Living Arrangements: Other (Comment)(Pt lives in [facility]) What gender do you identify as?: Male Marital status: Single Living Arrangements: Other (Comment), Non-relatives/Friends(With roommates at [facility]) Can pt return to current living arrangement?: Yes Admission Status: Voluntary Is patient capable of signing voluntary admission?: Yes Referral Source: Self/Family/Friend Insurance type: Medicaid [place] Living Arrangements: Other (Comment), Non-relatives/Friends(With roommates at [facility]) Legal Guardian: Other:(Self) Name of Psychiatrist: None Name of Therapist: None  Education Status Is patient currently in school?: No Is the patient employed, unemployed or receiving disability?: Unemployed  Risk to self with the past 6 months Suicidal Ideation: Yes-Currently Present Has patient been a risk to self within  the past 6 months prior to admission? : Yes Suicidal Intent: Yes-Currently Present Has patient had any suicidal intent within the  past 6 months prior to admission? : Yes Is patient at risk for suicide?: No Suicidal Plan?: No Has patient had any suicidal plan within the past 6 months prior to admission? : Yes Access to Means: No What has been your use of drugs/alcohol within the last 12 months?: Pt acknowledges marijuana use Previous Attempts/Gestures: Yes How many times?: 20(Pt states he has attempted to kill himself 20 - 30 times) Other Self Harm Risks: Pt has SA / significant hx of attempting to kill self Triggers for Past Attempts: Unpredictable Intentional Self Injurious Behavior: None(Pt denies engagement in NSSIB; cutting has been due to SI) Family Suicide History: No Recent stressful life event(s): Loss (Comment), Other (Comment)(Pt's father died in January 2020) Persecutory voices/beliefs?: No Depression: Yes Depression Symptoms: Despondent, Guilt, Feeling worthless/self pity, Loss of interest in usual pleasures Substance abuse history and/or treatment for substance abuse?: No Suicide prevention information given to non-admitted patients: Not applicable  Risk to Others within the past 6 months Homicidal Ideation: No Does patient have any lifetime risk of violence toward others beyond the six months prior to admission? : No Thoughts of Harm to Others: No Current Homicidal Intent: No Current Homicidal Plan: No Access to Homicidal Means: No Identified Victim: None noted History of harm to others?: No Assessment of Violence: None Noted Violent Behavior Description: None noted Does patient have access to weapons?: No(Pt denies access to guns/weapons) Criminal Charges Pending?: No Does patient have a court date: No Is patient on probation?: No  Psychosis Hallucinations: None noted Delusions: None noted  Mental Status Report Appearance/Hygiene: In scrubs Eye Contact: Fair Motor Activity: Freedom of movement(Pt is sitting up in a chair) Speech: Logical/coherent Level of Consciousness: Alert Mood:  Depressed Affect: Depressed Anxiety Level: Minimal Thought Processes: Coherent, Relevant Judgement: Partial Orientation: Person, Place, Time, Situation Obsessive Compulsive Thoughts/Behaviors: None  Cognitive Functioning Concentration: Normal Memory: Recent Intact, Remote Intact Is patient IDD: No Insight: Fair Impulse Control: Fair Appetite: Good Have you had any weight changes? : No Change Sleep: Decreased Total Hours of Sleep: 2 Vegetative Symptoms: None  ADLScreening [facility] Assessment Services) Patient's cognitive ability adequate to safely complete daily activities?: Yes Patient able to express need for assistance with ADLs?: Yes Independently performs ADLs?: Yes (appropriate for developmental age)  Prior Inpatient Therapy Prior Inpatient Therapy: Yes Prior Therapy Dates: Multiple [facility] Reason for Treatment: SI, MDD, Bipolar Disorder  Prior Outpatient Therapy Prior Outpatient Therapy: Yes Prior Therapy Dates: 10 years ago Prior Therapy Facilty/Provider(s): Pt cannot remember Reason for Treatment: MDD, Bipolar Disorder Does patient have an ACCT team?: No Does patient have Intensive In-House Services?  : No Does patient have [facility] services? : No Does patient have P4CC services?: No  ADL Screening (condition at time of admission) Patient's cognitive ability adequate to safely complete daily activities?: Yes Is the patient deaf or have difficulty hearing?: No Does the patient have difficulty seeing, even when wearing glasses/contacts?: No Does the patient have difficulty concentrating, remembering, or making decisions?: No Patient able to express need for assistance with ADLs?: Yes Does the patient have difficulty dressing or bathing?: No Independently performs ADLs?: Yes (appropriate for developmental age) Does the patient have difficulty walking or climbing stairs?: No Weakness of Legs: None Weakness of  Arms/Hands: None  Home Assistive Devices/Equipment Home Assistive Devices/Equipment: None  Therapy Consults (therapy consults  require a physician order) PT Evaluation Needed: No OT Evalulation Needed: No SLP Evaluation Needed: No Abuse/Neglect Assessment (Assessment to be complete while patient is alone) Abuse/Neglect Assessment Can Be Completed: Yes Physical Abuse: Denies Verbal Abuse: Denies Sexual Abuse: Denies Exploitation of patient/patient's resources: Denies Self-Neglect: Denies Values / Beliefs Cultural Requests During Hospitalization: None Spiritual Requests During Hospitalization: None Consults Spiritual Care Consult Needed: No Social Work Consult Needed: No Merchant navy officer (For Healthcare) Does Patient Have a Medical Advance Directive?: No Would patient like information on creating a medical advance directive?: No - Patient declined         Disposition: Nira Conn, NP, reviewed pt's chart and information and determined pt should be observed overnight for safety and stability and re-assessed in the morning. There are currently no beds available at the [facility], so pt will remain at [facility]. This information was provided to pt's nurse, Herminio Commons, at [phone].   Disposition Initial Assessment Completed for this Encounter: Yes Patient referred to: Other (Comment)(Pt will be observed overnight for safety and stability)  This service was provided via telemedicine using a 2-way, interactive audio and video technology.  Names of all persons participating in this telemedicine service and their role in this encounter. Name: [name] Role: Patient  Name: Nira Conn Role: Nurse Practitioner  Name: Duard Brady Role: Clinician    Ralph Dowdy 04/26/2019 4:49 AM

## 2019-04-26 NOTE — Progress Notes (Signed)
04/26/2019  [facility] to transport patient to [facility].

## 2019-04-26 NOTE — Progress Notes (Signed)
Patient is a 28 year old male who presented to [facility] voluntarily due to ongoing suicidal thoughts for the past 4 to 5 months. Pt has a hx of MDD and Bipolar disorder and has had multiple hospital admissions. Pt reports that he is ADD and is sometimes forgetful about taking his meds, Pt reports that he has been living in [place] x 2 months. Pt was calm and cooperative throughout admission process- Pt currently endorses passive SI, but verbally contracts for safety. VS obtained. Skin assessment revealed no abnormalities or contraband. Belongings secured in locker. Admission paperwork completed and signed- verbal understanding expressed. Patient oriented to unit. Q 15 min checks initiated for safety.

## 2019-04-27 DIAGNOSIS — F332 Major depressive disorder, recurrent severe without psychotic features: Secondary | ICD-10-CM

## 2019-04-27 DIAGNOSIS — F1220 Cannabis dependence, uncomplicated: Secondary | ICD-10-CM

## 2019-04-27 DIAGNOSIS — F1994 Other psychoactive substance use, unspecified with psychoactive substance-induced mood disorder: Secondary | ICD-10-CM

## 2019-04-27 LAB — HEMOGLOBIN A1C
Hgb A1c MFr Bld: 5.6 % (ref 4.8–5.6)
Mean Plasma Glucose: 114.02 mg/dL

## 2019-04-27 LAB — LIPID PANEL
Cholesterol: 133 mg/dL (ref 0–200)
HDL: 31 mg/dL — ABNORMAL LOW (ref >40)
LDL Cholesterol: 80 mg/dL (ref 0–99)
Total CHOL/HDL Ratio: 4.3 RATIO
Triglycerides: 108 mg/dL (ref <150)
VLDL: 22 mg/dL (ref 0–40)

## 2019-04-27 LAB — TSH: TSH: 2.386 u[IU]/mL (ref 0.350–4.500)

## 2019-04-27 MED ORDER — LITHIUM CARBONATE 300 MG PO CAPS
600.0000 mg | ORAL_CAPSULE | Freq: Two times a day (BID) | ORAL | Status: DC
  Administered 2019-04-27 – 2019-05-01 (×8): 600 mg via ORAL
  Filled 2019-04-27 (×10): qty 2

## 2019-04-27 MED ORDER — BUPROPION HCL ER (XL) 150 MG PO TB24
150.0000 mg | ORAL_TABLET | Freq: Every day | ORAL | Status: DC
  Administered 2019-04-27 – 2019-05-01 (×5): 150 mg via ORAL
  Filled 2019-04-27 (×6): qty 1

## 2019-04-27 MED ORDER — TRAZODONE HCL 50 MG PO TABS
50.0000 mg | ORAL_TABLET | Freq: Every evening | ORAL | Status: DC | PRN
  Administered 2019-04-27 – 2019-04-30 (×4): 50 mg via ORAL
  Filled 2019-04-27 (×5): qty 1

## 2019-04-27 NOTE — [facility] Suicide Risk Assessment (Signed)
[place] INPATIENT:  Family/Significant Other Suicide Prevention Education  Suicide Prevention Education:  Patient Refusal for Family/Significant Other Suicide Prevention Education: The patient [name] [name] has refused to provide written consent for family/significant other to be provided Family/Significant Other Suicide Prevention Education during admission and/or prior to discharge.  Physician notified.   SPE completed with pt, as pt refused to consent to family contact. SPI pamphlet provided to pt and pt was encouraged to share information with support network, ask questions, and talk about any concerns relating to SPE. Pt denies access to guns/firearms and verbalized understanding of information provided. Mobile Crisis information also provided to pt.  Rozann Lesches 04/27/2019, 1:24 PM

## 2019-04-27 NOTE — [facility] Suicide Risk Assessment (Addendum)
[facility] Admission Suicide Risk Assessment   Nursing information obtained from:  Patient Demographic factors:  Male, Unemployed, Caucasian Current Mental Status:  Suicidal ideation indicated by patient Loss Factors:  Loss of significant relationship Historical Factors:  NA Risk Reduction Factors:  Living with another person, especially a relative  Total Time spent with patient: 45 minutes Principal Problem: MDD versus Substance Induced Mood Disorder  Diagnosis:  Active Problems:   Substance induced mood disorder (HCC)  Subjective Data:   Continued Clinical Symptoms:    The "Alcohol Use Disorders Identification Test", Guidelines for Use in Primary Care, Second Edition.  World Science writer [facility]). Score between 0-7:  no or low risk or alcohol related problems. Score between 8-15:  moderate risk of alcohol related problems. Score between 16-19:  high risk of alcohol related problems. Score 20 or above:  warrants further diagnostic evaluation for alcohol dependence and treatment.   CLINICAL FACTORS:  28, no children, currently unemployed, lives at [facility] ( [facility]) . Patient presented to ED voluntarily on 11/29 reporting suicidal ideations, which he describes as passive. Reports he has been depressed for several months , following death of his father earlier this year. Denies psychotic symptoms. Endorses some neuro-vegetative symptoms of depression, mainly some anhedonia, low energy level . Denies medical illnesses, NKDA. Denies history of seizures or of head trauma Home medication list - Lithium 600 mgrs BID.  Admission Li level 0.27  History of several prior psychiatric admissions, initially at age 68. Most recently was admitted to [facility] about a month ago for depression and suicidal ideations, self cutting . Reports history of intermittent self cutting.  Reports prior history of Bipolar Disorder and ADHD diagnosis. Of note , currently does not endorse any clear  history of mania or hypomania and emphasizes history of depressive episodes . Denies history of psychosis.Does not endorse history of PTSD. Denies history of Panic / Agoraphobia. Denies history of violence . In the past has been on Topamax, Risperidone, Seroquel, states Lithium has helped the best thus far . Denies alcohol abuse. History of Cocaine Use Disorder, last used 4 months ago. History of Cannabis Use Disorder, was using regularly. Admission UDS (+) for cannabis, BAL negative.  Dx- Consider Bipolar Disorder, Depressed versus versus MDD, Cannabis Use Disorder. Cocaine Use Disorder .  Plan- inpatient admission. We reviewed treatment options- patient reports Lithium has been well tolerated and helpful and states he feels better on it. Although Li level subtherapeutic at admission will continue home dose of 600 mgrs BID for now, as medication compliance might have been suboptimal.  Agrees to Wellbutrin XL trial for depression, which he identifies as his major issue. No history of severe head trauma or seizure disorder Start Wellbutrin XL 150 mgrs QAM. Side effects reviewed, including potential risk of seizures .    Musculoskeletal: Strength & Muscle Tone: within normal limits Gait & Station: normal Patient leans: N/A  Psychiatric Specialty Exam: Physical Exam  ROS no headache, no chest pain,no shortness of breath, no cough, no nausea or vomiting, no rash   Blood pressure 123/79, pulse 92, temperature 98 F (36.7 C), temperature source Oral, resp. rate 16, height 6\' 1"  (1.854 m), weight 122.5 kg, SpO2 98 %.Body mass index is 35.62 kg/m.  General Appearance: Fairly Groomed  Eye Contact:  Fair  Speech:  Normal Rate  Volume:  Normal  Mood:  reports he is feeling better today  Affect:  vaguely blunted   Thought Process:  Linear and Descriptions of  Associations: Circumstantial  Orientation:  Full (Time, Place, and Person)  Thought Content:  denies hallucinations , no delusions , not  internally preoccupied   Suicidal Thoughts:  No denies suicidal or self injurious ideations, contracts for safety on unit.  Denies homicidal or violent ideations, contracts for safety   Homicidal Thoughts:  No  Memory:  recent and remote fair   Judgement:  Fair  Insight:  Fair  Psychomotor Activity:  Normal no restlessness or agitation, no distal tremors  Concentration:  Concentration: Fair and Attention Span: Fair  Recall:  [employer] of Knowledge:  Fair  Language:  Fair  Akathisia:  Negative  Handed:  Right  AIMS (if indicated):     Assets:  Desire for Improvement Resilience  ADL's:  Intact  Cognition:  WNL  Sleep:  Number of Hours: 6.75      COGNITIVE FEATURES THAT CONTRIBUTE TO RISK:  Closed-mindedness and Loss of executive function    SUICIDE RISK:   Moderate:  Frequent suicidal ideation with limited intensity, and duration, some specificity in terms of plans, no associated intent, good self-control, limited dysphoria/symptomatology, some risk factors present, and identifiable protective factors, including available and accessible social support.  PLAN OF CARE: Patient will be admitted to inpatient psychiatric unit for stabilization and safety. Will provide and encourage milieu participation. Provide medication management and maked adjustments as needed.  Will follow daily.    I certify that inpatient services furnished can reasonably be expected to improve the patient's condition.   Jenne Campus, MD 04/27/2019, 11:07 AM

## 2019-04-27 NOTE — Progress Notes (Signed)
Adult Psychoeducational Group Note  Date:  04/27/2019 Time:  8:28 AM  Group Topic/Focus:  Wrap-Up Group:   The focus of this group is to help patients review their daily goal of treatment and discuss progress on daily workbooks.  Participation Level:  Active  Participation Quality:  Attentive  Affect:  Flat  Cognitive:  Alert  Insight: Lacking  Engagement in Group:  Off Topic  Modes of Intervention:  Confrontation  Additional Comments:  Pt attend wrap up group. Pt rate his day as a 5. He thinking how and what to fix himself. He do not have a goal for today. He did not achieve his goal for today. He found big books 12 to be the most helpful. His questions and concerns no food. Pt states this is bull shit.   Lenice Llamas Long 04/27/2019, 8:28 AM

## 2019-04-27 NOTE — Progress Notes (Signed)
Spiritual care group on grief and loss facilitated by chaplain Jerene Pitch MDiv, BCC  Group Goal:  Support / Education around grief and loss Members engage in facilitated group support and psycho-social education.  Group Description:  Following introductions and group rules, group members engaged in facilitated group dialog and support around topic of loss, with particular support around experiences of loss in their lives. Group Identified types of loss (relationships / self / things) and identified patterns, circumstances, and changes that precipitate losses. Reflected on thoughts / feelings around loss, normalized grief responses, and recognized variety in grief experience.   Group noted Worden's four tasks of grief in discussion.  Group drew on Adlerian / Rogerian, narrative, MI, Patient Progress:  Present at beginning of group.  Avon left group after 5 minutes.  Did not return.

## 2019-04-27 NOTE — H&P (Addendum)
Psychiatric Admission Assessment Adult  Patient Identification: [name] [name] MRN:  [id number] Date of Evaluation:  04/27/2019 Chief Complaint:  Cocaine Sub Abuse SI Principal Diagnosis: <principal problem not specified> Diagnosis:  Active Problems:   Substance induced mood disorder (HCC)   Severe episode of recurrent major depressive disorder, without psychotic features ([place])   Cannabis dependence ([place])  History of Present Illness: [name] [name] is a 28 year old male with history of depression and cocaine use disorder in remission, presenting voluntarily for treatment of depression with suicidal ideation. He stays in a [facility] and had made threats to hang himself from the balcony there. He is vague regarding recent stressors but reports increased depression since his father passed away in June 03, 2018. He is prescribed lithium 600 mg BID for the last two months since discharge from [facility]. He reports medication adherence and feels it has been helpful. Admission lithium level was 0.27. He has history of cocaine use disorder but denies use over the last four months. He was also using marijuana daily prior to moving into [place] but denies any drug use since that time. UDS positive for THC. BAL<10. He reports improved mood since admission and denies SI. Denies HI/AVH.   Associated Signs/Symptoms: Depression Symptoms:  depressed mood, anhedonia, fatigue, suicidal thoughts with specific plan, (Hypo) Manic Symptoms:  denies Anxiety Symptoms:  Excessive Worry, Psychotic Symptoms:  denies PTSD Symptoms: Negative Total Time spent with patient: 30 minutes  Past Psychiatric History: History of depression and cocaine use disorder with multiple hospitalizations starting in adolescence. Most recently hospitalized at [facility] two months ago for SI. Reports history of multiple suicide attempts. No clear history of mania or psychosis.   Is the patient at risk to  self? Yes.    Has the patient been a risk to self in the past 6 months? No.  Has the patient been a risk to self within the distant past? Yes.    Is the patient a risk to others? No.  Has the patient been a risk to others in the past 6 months? No.  Has the patient been a risk to others within the distant past? No.   Prior Inpatient Therapy:   Prior Outpatient Therapy:    Alcohol Screening:   Substance Abuse History in the last 12 months:  No. Consequences of Substance Abuse: NA Previous Psychotropic Medications: Yes  Psychological Evaluations: No  Past Medical History: History reviewed. No pertinent past medical history. History reviewed. No pertinent surgical history. Family History: History reviewed. No pertinent family history. Family Psychiatric  History: Denies Tobacco Screening:   Social History:  Social History   Substance and Sexual Activity  Alcohol Use None     Social History   Substance and Sexual Activity  Drug Use Not on file    Additional Social History:                           Allergies:  No Known Allergies Lab Results:  Results for orders placed or performed during the hospital encounter of 04/26/19 (from the past 48 hour(s))  Hemoglobin A1c     Status: None   Collection Time: 04/27/19  6:44 AM  Result Value Ref Range   Hgb A1c MFr Bld 5.6 4.8 - 5.6 %    Comment: (NOTE) Pre diabetes:          5.7%-6.4% Diabetes:              >  6.4% Glycemic control for   <7.0% adults with diabetes    Mean Plasma Glucose 114.02 mg/dL    Comment: Performed at [facility] [address], [place], [place] [address]  Lipid panel     Status: Abnormal   Collection Time: 04/27/19  6:44 AM  Result Value Ref Range   Cholesterol 133 0 - 200 mg/dL   Triglycerides 108 <150 mg/dL   HDL 31 (L) >40 mg/dL   Total CHOL/HDL Ratio 4.3 RATIO   VLDL 22 0 - 40 mg/dL   LDL Cholesterol 80 0 - 99 mg/dL    Comment:        Total Cholesterol/HDL:CHD Risk Coronary  Heart Disease Risk Table                     Men   Women  1/2 Average Risk   3.4   3.3  Average Risk       5.0   4.4  2 X Average Risk   9.6   7.1  3 X Average Risk  23.4   11.0        Use the calculated Patient Ratio above and the CHD Risk Table to determine the patient's CHD Risk.        ATP III CLASSIFICATION (LDL):  <100     mg/dL   Optimal  100-129  mg/dL   Near or Above                    Optimal  130-159  mg/dL   Borderline  160-189  mg/dL   High  >190     mg/dL   Very High Performed at [facility] [address] [place], [place] [address]   TSH     Status: None   Collection Time: 04/27/19  6:44 AM  Result Value Ref Range   TSH 2.386 0.350 - 4.500 uIU/mL    Comment: Performed by a 3rd Generation assay with a functional sensitivity of <=0.01 uIU/mL. Performed at [facility], [place] [address], [place], [place] [address]     Blood Alcohol level:  Lab Results  Component Value Date   ETH <10 97/35/3299    Metabolic Disorder Labs:  Lab Results  Component Value Date   HGBA1C 5.6 04/27/2019   MPG 114.02 04/27/2019   No results found for: PROLACTIN Lab Results  Component Value Date   CHOL 133 04/27/2019   TRIG 108 04/27/2019   HDL 31 (L) 04/27/2019   CHOLHDL 4.3 04/27/2019   VLDL 22 04/27/2019   [facility] 80 04/27/2019    Current Medications: Current Facility-Administered Medications  Medication Dose Route Frequency Provider Last Rate Last Dose  . acetaminophen (TYLENOL) tablet 650 mg  650 mg Oral Q6H PRN Suella Broad, FNP      . alum & mag hydroxide-simeth (MAALOX/MYLANTA) 200-200-20 MG/5ML suspension 30 mL  30 mL Oral Q4H PRN Suella Broad, FNP      . buPROPion (WELLBUTRIN XL) 24 hr tablet 150 mg  150 mg Oral Daily , Myer Peer, MD   150 mg at 04/27/19 1240  . lithium carbonate capsule 600 mg  600 mg Oral BID WC ,  A, MD      . magnesium hydroxide (MILK OF MAGNESIA) suspension 30 mL   30 mL Oral Daily PRN Burt Ek, Gayland Curry, FNP      . naproxen (NAPROSYN) tablet 500 mg  500 mg Oral BID Suella Broad,  FNP      . nicotine (NICODERM CQ - dosed in mg/24 hours) patch 21 mg  21 mg Transdermal Once Suella Broad, FNP   21 mg at 04/26/19 1823  . traZODone (DESYREL) tablet 50 mg  50 mg Oral QHS PRN , Myer Peer, MD       PTA Medications: Medications Prior to Admission  Medication Sig Dispense Refill Last Dose  . lithium 600 MG capsule Take 600 mg by mouth 2 (two) times daily with a meal.     . naproxen (NAPROSYN) 500 MG tablet Take 1 tablet (500 mg total) by mouth 2 (two) times daily. 20 tablet 0     Musculoskeletal: Strength & Muscle Tone: within normal limits Gait & Station: normal Patient leans: N/A  Psychiatric Specialty Exam: Physical Exam  Nursing note and vitals reviewed. Constitutional: He is oriented to person, place, and time. He appears well-developed and well-nourished.  Cardiovascular: Normal rate.  Respiratory: Effort normal.  Neurological: He is alert and oriented to person, place, and time.    Review of Systems  Constitutional: Negative.   Respiratory: Negative for cough and shortness of breath.   Cardiovascular: Negative for chest pain.  Psychiatric/Behavioral: Positive for depression and substance abuse (remote hx cocaine, THC). Negative for hallucinations and suicidal ideas. The patient is not nervous/anxious and does not have insomnia.     Blood pressure 123/79, pulse 92, temperature 98 F (36.7 C), temperature source Oral, resp. rate 16, height '6\' 1"'  (1.854 m), weight 122.5 kg, SpO2 98 %.Body mass index is 35.62 kg/m.  General Appearance: Fairly Groomed  Eye Contact:  Fair  Speech:  Normal Rate  Volume:  Normal  Mood:  Depressed  Affect:  Congruent  Thought Process:  Coherent  Orientation:  Full (Time, Place, and Person)  Thought Content:  Logical  Suicidal Thoughts:  No  Homicidal Thoughts:  No  Memory:   Immediate;   Fair Recent;   Fair  Judgement:  Intact  Insight:  Fair  Psychomotor Activity:  Normal  Concentration:  Concentration: Fair and Attention Span: Fair  Recall:  [employer] of Knowledge:  Fair  Language:  Fair  Akathisia:  No  Handed:  Right  AIMS (if indicated):     Assets:  Communication Skills Housing Resilience  ADL's:  Intact  Cognition:  WNL  Sleep:  Number of Hours: 6.75    Treatment Plan Summary: Daily contact with patient to assess and evaluate symptoms and progress in treatment and Medication management   Inpatient hospitalization.  See MD's admission SRA for medication management.  Patient will participate in the therapeutic group milieu.  Discharge disposition in progress.   Observation Level/Precautions:  15 minute checks  Laboratory:  Reviewed  Psychotherapy:  Group therapy  Medications:  See MAR  Consultations:  PRN  Discharge Concerns:  Safety and stabilization  Estimated LOS: 3-5 days  Other:     Physician Treatment Plan for Primary Diagnosis: <principal problem not specified> Long Term Goal(s): Improvement in symptoms so as ready for discharge  Short Term Goals: Ability to identify changes in lifestyle to reduce recurrence of condition will improve, Ability to verbalize feelings will improve and Ability to disclose and discuss suicidal ideas  Physician Treatment Plan for Secondary Diagnosis: Active Problems:   Substance induced mood disorder (HCC)   Severe episode of recurrent major depressive disorder, without psychotic features ([place])   Cannabis dependence ([place])  Long Term Goal(s): Improvement in symptoms so as ready for discharge  Short  Term Goals: Ability to demonstrate self-control will improve and Ability to identify and develop effective coping behaviors will improve  I certify that inpatient services furnished can reasonably be expected to improve the patient's condition.    Connye Burkitt, NP 11/30/20201:21 PM   I have  discussed case with NP and have met with patient  Agree with NP note and assessment  28, no children, currently unemployed, lives at [facility] . Patient presented to ED voluntarily on 11/29 reporting suicidal ideations, which he describes as passive. Reports he has been depressed for several months , following death of his father earlier this year. Denies psychotic symptoms. Endorses some neuro-vegetative symptoms of depression, mainly some anhedonia, low energy level . Denies medical illnesses, NKDA. Denies history of seizures or of head trauma Home medication list - Lithium 600 mgrs BID.  Admission Li level 0.27  History of several prior psychiatric admissions, initially at age 52. Most recently was admitted to [facility] about a month ago for depression and suicidal ideations, self cutting . Reports history of intermittent self cutting.  Reports prior history of Bipolar Disorder and ADHD diagnosis. Of note , currently does not endorse any clear history of mania or hypomania and emphasizes history of depressive episodes . Denies history of psychosis.Does not endorse history of PTSD. Denies history of Panic / Agoraphobia. Denies history of violence . In the past has been on Topamax, Risperidone, Seroquel, states Lithium has helped the best thus far . Denies alcohol abuse. History of Cocaine Use Disorder, last used 4 months ago. History of Cannabis Use Disorder, was using regularly. Admission UDS (+) for cannabis, BAL negative.  Dx- Consider Bipolar Disorder, Depressed versus versus MDD, Cannabis Use Disorder. Cocaine Use Disorder .  Plan- inpatient admission. We reviewed treatment options- patient reports Lithium has been well tolerated and helpful and states he feels better on it. Although Li level subtherapeutic at admission will continue home dose of 600 mgrs BID for now, as medication compliance might have been suboptimal.  Agrees to Wellbutrin XL trial for depression,  which he identifies as his major issue. No history of severe head trauma or seizure disorder Start Wellbutrin XL 150 mgrs QAM. Side effects reviewed, including potential risk of seizures .

## 2019-04-27 NOTE — BHH Counselor (Signed)
Adult Comprehensive Assessment  Patient ID: [name] [name], male   DOB: [birth year], 28 y.o.   MRN: [id number]  Information Source: Information source: Patient  Current Stressors:  Patient states their primary concerns and needs for treatment are:: Pt reports "suicidal thoughts".  Patient reports that he is not currenlty experiencing those thoughts. Patient states their goals for this hospitilization and ongoing recovery are:: Pt reports "get on medicine and get out of here". Educational / Learning stressors: Pt denies. Employment / Job issues: Pt denies. Family Relationships: Pt denies. Financial / Lack of resources (include bankruptcy): Pt denies. Housing / Lack of housing: Pt denies. Physical health (include injuries & life threatening diseases): Pt denies. Social relationships: Pt denies. Substance abuse: Pt reports cocaine use. Bereavement / Loss: Pt reports "my father".  Pt reports that father passed Jun 03, 2018.  Living/Environment/Situation:  Living Arrangements: Non-relatives/Friends Living conditions (as described by patient or guardian): Pt lives in sober living home. Who else lives in the home?: Pt reports others seeking sobriety. How long has patient lived in current situation?: Pt reports "2 1/2 months". What is atmosphere in current home: Comfortable  Family History:  Marital status: Single Are you sexually active?: No What is your sexual orientation?: Heterosexual Has your sexual activity been affected by drugs, alcohol, medication, or emotional stress?: Pt denies. Does patient have children?: No  Childhood History:  By whom was/is the patient raised?: Both parents Additional childhood history information: Pt reports that he was "in and out" of group homes the majority of his childhood. Description of patient's relationship with caregiver when they were a child: Pt reports "good with my dad, not so good with my mom". Patient's description of current relationship  with people who raised him/her: Pt reports that father is deceased.  Pt reports that he does not have a relationship with his mother. How were you disciplined when you got in trouble as a child/adolescent?: Pt reports "the cops were called". Does patient have siblings?: Yes Number of Siblings: 1 Description of patient's current relationship with siblings: Pt reports "we get along". Did patient suffer any verbal/emotional/physical/sexual abuse as a child?: No Did patient suffer from severe childhood neglect?: No Has patient ever been sexually abused/assaulted/raped as an adolescent or adult?: No Was the patient ever a victim of a crime or a disaster?: No Witnessed domestic violence?: No Has patient been effected by domestic violence as an adult?: No  Education:  Highest grade of school patient has completed: 9th Currently a student?: No Learning disability?: Yes What learning problems does patient have?: Pt reports that he does not know what the disability was but "was in a special ed class all day".  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 Did You Receive Any Psychiatric Treatment/Services While in the Military?: No(NA) Are There Guns or Other Weapons in Your Home?: No  Financial Resources:   Financial resources: Medicaid Does patient have a representative payee or guardian?: No  Alcohol/Substance Abuse:   What has been your use of drugs/alcohol within the last 12 months?: Cocaine: every weekend until all the money was gone If attempted suicide, did drugs/alcohol play a role in this?: No Alcohol/Substance Abuse Treatment Hx: Denies past history Has alcohol/substance abuse ever caused legal problems?: No  Social Support System:   Conservation officer, nature Support System: Poor Describe Community Support System: Pt reports  "me".  Type of faith/religion: Pt denies.  Leisure/Recreation:   Leisure and Hobbies: Pt reports "watching TV and relaxing".  Strengths/Needs:   What is the patient's perception of their strengths?: Pt reports "reading and word searches". Patient states they can use these personal strengths during their treatment to contribute to their recovery: Pt reports "keep reading". Patient states these barriers may affect/interfere with their treatment: Pt denies. Patient states these barriers may affect their return to the community: Pt denies.  Discharge Plan:   Currently receiving community mental health services: No Patient states concerns and preferences for aftercare planning are: Pt reports that he wants a referral for outpatient treatment. Patient states they will know when they are safe and ready for discharge when: Pt reports "because the medicine is working". Does patient have access to transportation?: Yes Does patient have financial barriers related to discharge medications?: No Will patient be returning to same living situation after discharge?: Yes  Summary/Recommendations:   Summary and Recommendations (to be completed by the evaluator): Patient is a 28 year old single male from [place], [place] ([place]).  He presents to the hospital following an increase in depressive symptoms and suicidal ideations.  He has a primary diagnosis of Major Depressive Disorder, recurrent episode, Severe.  Recommendations include: crisis stabilization, therapeutic milieu, encourage group attendance and participation, medication management for detox/mood stabilization and development of comprehensive mental wellness/sobriety plan.  Rozann Lesches. 04/27/2019

## 2019-04-27 NOTE — BHH Group Notes (Signed)
LCSW Group Therapy Note 04/27/2019 2:31 PM  Type of Therapy and Topic: Group Therapy: Overcoming Obstacles  Participation Level: Did Not Attend  Description of Group:  In this group patients will be encouraged to explore what they see as obstacles to their own wellness and recovery. They will be guided to discuss their thoughts, feelings, and behaviors related to these obstacles. The group will process together ways to cope with barriers, with attention given to specific choices patients can make. Each patient will be challenged to identify changes they are motivated to make in order to overcome their obstacles. This group will be process-oriented, with patients participating in exploration of their own experiences as well as giving and receiving support and challenge from other group members.  Therapeutic Goals: 1. Patient will identify personal and current obstacles as they relate to admission. 2. Patient will identify barriers that currently interfere with their wellness or overcoming obstacles.  3. Patient will identify feelings, thought process and behaviors related to these barriers. 4. Patient will identify two changes they are willing to make to overcome these obstacles:   Summary of Patient Progress  Invited, chose not to attend.    Therapeutic Modalities:  Cognitive Behavioral Therapy Solution Focused Therapy Motivational Interviewing Relapse Prevention Therapy   Ozelle Brubacher LCSWA Clinical Social Worker   

## 2019-04-27 NOTE — Tx Team (Signed)
Interdisciplinary Treatment and Diagnostic Plan Update  04/27/2019 Time of Session: 10:00am [name] [name] MRN: [id number]  Principal Diagnosis: <principal problem not specified>  Secondary Diagnoses: Active Problems:   Substance induced mood disorder (HCC)   Current Medications:  Current Facility-Administered Medications  Medication Dose Route Frequency Provider Last Rate Last Dose  . acetaminophen (TYLENOL) tablet 650 mg  650 mg Oral Q6H PRN Suella Broad, FNP      . alum & mag hydroxide-simeth (MAALOX/MYLANTA) 200-200-20 MG/5ML suspension 30 mL  30 mL Oral Q4H PRN Burt Ek, Gayland Curry, FNP      . hydrOXYzine (ATARAX/VISTARIL) tablet 25 mg  25 mg Oral TID PRN Suella Broad, FNP   25 mg at 04/27/19 0936  . lithium carbonate capsule 750 mg  750 mg Oral BID WC Suella Broad, FNP   750 mg at 04/27/19 0935  . magnesium hydroxide (MILK OF MAGNESIA) suspension 30 mL  30 mL Oral Daily PRN Suella Broad, FNP      . naproxen (NAPROSYN) tablet 500 mg  500 mg Oral BID Starkes-Perry, Takia S, FNP      . nicotine (NICODERM CQ - dosed in mg/24 hours) patch 21 mg  21 mg Transdermal Once Suella Broad, FNP   21 mg at 04/26/19 1823  . traZODone (DESYREL) tablet 100 mg  100 mg Oral QHS PRN Suella Broad, FNP   100 mg at 04/26/19 2224   PTA Medications: Medications Prior to Admission  Medication Sig Dispense Refill Last Dose  . lithium 600 MG capsule Take 600 mg by mouth 2 (two) times daily with a meal.     . naproxen (NAPROSYN) 500 MG tablet Take 1 tablet (500 mg total) by mouth 2 (two) times daily. 20 tablet 0     Patient Stressors: Loss of father Medication change or noncompliance Other: Suicidal ideation  Patient Strengths: Average or above average intelligence Capable of independent living Communication skills Motivation for treatment/growth Physical Health  Treatment Modalities: Medication Management, Group therapy, Case management,   1 to 1 session with clinician, Psychoeducation, Recreational therapy.   Physician Treatment Plan for Primary Diagnosis: <principal problem not specified> Long Term Goal(s):     Short Term Goals:    Medication Management: Evaluate patient's response, side effects, and tolerance of medication regimen.  Therapeutic Interventions: 1 to 1 sessions, Unit Group sessions and Medication administration.  Evaluation of Outcomes: Not Met  Physician Treatment Plan for Secondary Diagnosis: Active Problems:   Substance induced mood disorder ([place])  Long Term Goal(s):     Short Term Goals:       Medication Management: Evaluate patient's response, side effects, and tolerance of medication regimen.  Therapeutic Interventions: 1 to 1 sessions, Unit Group sessions and Medication administration.  Evaluation of Outcomes: Not Met   RN Treatment Plan for Primary Diagnosis: <principal problem not specified> Long Term Goal(s): Knowledge of disease and therapeutic regimen to maintain health will improve  Short Term Goals: Ability to participate in decision making will improve, Ability to verbalize feelings will improve, Ability to disclose and discuss suicidal ideas, Ability to identify and develop effective coping behaviors will improve and Compliance with prescribed medications will improve  Medication Management: RN will administer medications as ordered by provider, will assess and evaluate patient's response and provide education to patient for prescribed medication. RN will report any adverse and/or side effects to prescribing provider.  Therapeutic Interventions: 1 on 1 counseling sessions, Psychoeducation, Medication administration, Evaluate responses to treatment, Monitor vital signs  and CBGs as ordered, Perform/monitor CIWA, COWS, AIMS and Fall Risk screenings as ordered, Perform wound care treatments as ordered.  Evaluation of Outcomes: Not Met   LCSW Treatment Plan for Primary Diagnosis:  <principal problem not specified> Long Term Goal(s): Safe transition to appropriate next level of care at discharge, Engage patient in therapeutic group addressing interpersonal concerns.  Short Term Goals: Engage patient in aftercare planning with referrals and resources  Therapeutic Interventions: Assess for all discharge needs, 1 to 1 time with Social worker, Explore available resources and support systems, Assess for adequacy in community support network, Educate family and significant other(s) on suicide prevention, Complete Psychosocial Assessment, Interpersonal group therapy.  Evaluation of Outcomes: Not Met   Progress in Treatment: Attending groups: No. New to unit  Participating in groups: No. Taking medication as prescribed: Yes. Toleration medication: Yes. Family/Significant other contact made: No, will contact:  if patient consents to collateral contacts Patient understands diagnosis: Yes. Discussing patient identified problems/goals with staff: Yes. Medical problems stabilized or resolved: Yes. Denies suicidal/homicidal ideation: No. Issues/concerns per patient self-inventory: No. Other:   New problem(s) identified: None   New Short Term/Long Term Goal(s): Detox, medication stabilization, elimination of SI thoughts, development of comprehensive mental wellness plan.    Patient Goals:  "Get back on the right medicine"   Discharge Plan or Barriers: Patient recently admitted. CSW will continue to follow and assess for appropriate referrals and possible discharge planning.    Reason for Continuation of Hospitalization: Depression Medication stabilization Suicidal ideation  Estimated Length of Stay: 3-5 days   Attendees: Patient: [name] [name]  04/27/2019 11:03 AM  Physician: Dr. Neita Garnet, MD 04/27/2019 11:03 AM  Nursing: Rise Paganini.Raliegh Ip, RN 04/27/2019 11:03 AM  RN Care Manager: 04/27/2019 11:03 AM  Social Worker: Radonna Ricker, LCSW 04/27/2019 11:03 AM   Recreational Therapist:  04/27/2019 11:03 AM  Other: Harriett Sine, NP 04/27/2019 11:03 AM  Other:  04/27/2019 11:03 AM  Other: 04/27/2019 11:03 AM    Scribe for Treatment Team: Marylee Floras, [place] 04/27/2019 11:03 AM

## 2019-04-27 NOTE — Progress Notes (Signed)
   04/27/19 2100  Psych Admission Type (Psych Patients Only)  Admission Status Voluntary  Psychosocial Assessment  Patient Complaints Agitation  Eye Contact Glaring  Facial Expression Angry  Affect Angry  Speech Argumentative  Interaction Avoidant  Motor Activity Restless  Appearance/Hygiene Disheveled  Behavior Characteristics Agitated  Mood Labile  Aggressive Behavior  Effect No apparent injury  Thought Process  Coherency Circumstantial  Content Blaming others  Delusions WDL  Perception WDL  Hallucination None reported or observed  Judgment Poor  Confusion WDL  Danger to Self  Current suicidal ideation? Denies  Danger to Others  Danger to Others None reported or observed   Pt agitated this evening. "I don't want to talk to you. I just want to leave. All these places are the same. I assume I will be here 2 weeks, when I assume I always assume right"

## 2019-04-27 NOTE — Progress Notes (Signed)
The patient rated his day as a 7 out of a possible 10. He is complaining of sleeping a great deal due to the medication. He also feels that he has been prescribed too much medication.

## 2019-04-27 NOTE — Progress Notes (Signed)
Recreation Therapy Notes  Date:  11.30.20 Time: 0930 Location: 300 Hall Dayroom  Group Topic: Stress Management  Goal Area(s) Addresses:  Patient will identify positive stress management techniques. Patient will identify benefits of using stress management post d/c.  Behavioral Response:  Engaged  Intervention: Stress Management  Activity : Meditation.  LRT played a meditation that focused on making choices.  Patients were to listen and follow along as meditation played to engage in activity.  Education:  Stress Management, Discharge Planning.   Education Outcome: Acknowledges Education  Clinical Observations/Feedback:  Pt attended and participated in the activity.    Victorino Sparrow, LRT/CTRS         Ria Comment, Matie Dimaano A 04/27/2019 11:13 AM

## 2019-04-27 NOTE — Progress Notes (Signed)
Patient Self Inventory Sheet, Pt reports good appetite, energy normal, and good sleeping pattern. Pt rates her depression, hopelessness , and anxiety at 0 out of 10. Pt denies SI /HI Pt reports no physical problems. Vitals being monitored. Pt states her most important goals is to get medication that works for her. Vital signs are  WNL. Medication given as Rx, Safety maintained with 15 minute checks.  [place] NOVEL CORONAVIRUS (COVID-19) DAILY CHECK-OFF SYMPTOMS - answer yes or no to each - every day NO YES  Have you had a fever in the past 24 hours?  . Fever (Temp > 37.80C / 100F) X   Have you had any of these symptoms in the past 24 hours? . New Cough .  Sore Throat  .  Shortness of Breath .  Difficulty Breathing .  Unexplained Body Aches   X   Have you had any one of these symptoms in the past 24 hours not related to allergies?   . Runny Nose .  Nasal Congestion .  Sneezing   X   If you have had runny nose, nasal congestion, sneezing in the past 24 hours, has it worsened?  X   EXPOSURES - check yes or no X   Have you traveled outside the state in the past 14 days?  X   Have you been in contact with someone with a confirmed diagnosis of COVID-19 or PUI in the past 14 days without wearing appropriate PPE?  X   Have you been living in the same home as a person with confirmed diagnosis of COVID-19 or a PUI (household contact)?    X   Have you been diagnosed with COVID-19?    X              What to do next: Answered NO to all: Answered YES to anything:   Proceed with unit schedule Follow the [facility] Inpatient Flowsheet.

## 2019-04-28 MED ORDER — CARBAMIDE PEROXIDE 6.5 % OT SOLN
5.0000 [drp] | Freq: Two times a day (BID) | OTIC | Status: DC
  Administered 2019-04-28: 5 [drp] via OTIC
  Filled 2019-04-28: qty 15

## 2019-04-28 NOTE — [facility] Counselor (Signed)
CSW spoke with Ronalee Belts the Director at the patient's [facility] [phone].  He reports that the patient can return to the home.  He reports that he attempts "to hold them to the 5 day to teach responsibility".    He reports that he or one of his drivers will pick the patient up at discharge, he does not need to complete the interview again per Ronalee Belts.  Ronalee Belts reports that pt will need a referral for outpatient for additional treatment outside of AA, including medication management.   Assunta Curtis, MSW, LCSW 04/28/2019 1:21 PM

## 2019-04-28 NOTE — Progress Notes (Signed)
   04/28/19 2100  Psych Admission Type (Psych Patients Only)  Admission Status Voluntary  Psychosocial Assessment  Patient Complaints None  Eye Contact Intense  Facial Expression Angry  Affect Angry  Speech Logical/coherent  Interaction Minimal  Motor Activity Other (Comment) (WDL)  Appearance/Hygiene Disheveled  Behavior Characteristics Anxious  Mood Labile  Thought Process  Coherency Circumstantial  Content WDL  Delusions None reported or observed  Perception WDL  Hallucination None reported or observed  Judgment Poor  Confusion None  Danger to Self  Current suicidal ideation? Denies  Danger to Others  Danger to Others None reported or observed   Pt less irritable this evening. Pt visible in the dayroom playing cards with peers.

## 2019-04-28 NOTE — Progress Notes (Signed)
[facility]  04/28/2019 1:55 PM Lindell SparJoshua Crissman  MRN:  [id number] Subjective: Patient is a 28 year old male with a history of depression and cocaine use disorder who presented for voluntary treatment of depression on 04/27/2019 with suicidal ideation.  Objective: Patient is seen in follow-up.  From review of the records he appears from yesterday essentially unchanged.  He stated his lithium dose was 750 mg p.o. twice daily.  We have been unable to confirm that.  He is currently on 600 mg p.o. twice daily.  His lithium level on admission was 0.27.  He admitted that he had been noncompliant with his lithium most recently.  He also has a history of cocaine use disorder, and [facility].  His drug screen on admission was positive for marijuana as well.  He has been living at the sober living house most recently.  He denied suicidal ideation this morning, but is significantly agitated and appears to be labile.  His vital signs are stable, he is afebrile.  He slept 6 hours last night.  Review of his laboratories showed a mildly elevated glucose at 141, and a mildly elevated white blood cell count at 16.8.  Again, his lithium level on admission was 0.27.  His TSH was 2.386.  Drug screen was only positive for marijuana.  Principal Problem: <principal problem not specified> Diagnosis: Active Problems:   Substance induced mood disorder (HCC)   Severe episode of recurrent major depressive disorder, without psychotic features (HCC)   Cannabis dependence (HCC)  Total Time spent with patient: 20 minutes  Past Psychiatric History: See admission H&P  Past Medical History: History reviewed. No pertinent past medical history. History reviewed. No pertinent surgical history. Family History: History reviewed. No pertinent family history. Family Psychiatric  History: See admission H&P Social History:  Social History   Substance and Sexual Activity  Alcohol Use None      Social History   Substance and Sexual Activity  Drug Use Not on file    Social History   Socioeconomic History  . Marital status: Single    Spouse name: Not on file  . Number of children: Not on file  . Years of education: Not on file  . Highest education level: Not on file  Occupational History  . Not on file  Social Needs  . Financial resource strain: Not on file  . Food insecurity    Worry: Not on file    Inability: Not on file  . Transportation needs    Medical: Not on file    Non-medical: Not on file  Tobacco Use  . Smoking status: Current Every Day Smoker    Types: Cigarettes  . Smokeless tobacco: Never Used  Substance and Sexual Activity  . Alcohol use: Not on file  . Drug use: Not on file  . Sexual activity: Not on file  Lifestyle  . Physical activity    Days per week: Not on file    Minutes per session: Not on file  . Stress: Not on file  Relationships  . Social Musicianconnections    Talks on phone: Not on file    Gets together: Not on file    Attends religious service: Not on file    Active member of club or organization: Not on file    Attends meetings of clubs or organizations: Not on file    Relationship status: Not on file  Other Topics Concern  . Not on file  Social History  Narrative  . Not on file   Additional Social History:                         Sleep: Fair  Appetite:  Fair  Current Medications: Current Facility-Administered Medications  Medication Dose Route Frequency Provider Last Rate Last Dose  . acetaminophen (TYLENOL) tablet 650 mg  650 mg Oral Q6H PRN Maryagnes Amos, FNP   650 mg at 04/28/19 1314  . alum & mag hydroxide-simeth (MAALOX/MYLANTA) 200-200-20 MG/5ML suspension 30 mL  30 mL Oral Q4H PRN Maryagnes Amos, FNP      . buPROPion (WELLBUTRIN XL) 24 hr tablet 150 mg  150 mg Oral Daily Cobos, Rockey Situ, MD   150 mg at 04/28/19 0823  . carbamide peroxide (DEBROX) 6.5 % OTIC (EAR) solution 5 drop  5 drop  Left EAR BID Cobos, Fernando A, MD      . lithium carbonate capsule 600 mg  600 mg Oral BID WC Cobos, Rockey Situ, MD   600 mg at 04/28/19 0823  . magnesium hydroxide (MILK OF MAGNESIA) suspension 30 mL  30 mL Oral Daily PRN Maryagnes Amos, FNP      . naproxen (NAPROSYN) tablet 500 mg  500 mg Oral BID Maryagnes Amos, FNP   500 mg at 04/28/19 6256  . traZODone (DESYREL) tablet 50 mg  50 mg Oral QHS PRN Cobos, Rockey Situ, MD   50 mg at 04/27/19 2100    Lab Results:  Results for orders placed or performed during the hospital encounter of 04/26/19 (from the past 48 hour(s))  Hemoglobin A1c     Status: None   Collection Time: 04/27/19  6:44 AM  Result Value Ref Range   Hgb A1c MFr Bld 5.6 4.8 - 5.6 %    Comment: (NOTE) Pre diabetes:          5.7%-6.4% Diabetes:              >6.4% Glycemic control for   <7.0% adults with diabetes    Mean Plasma Glucose 114.02 mg/dL    Comment: Performed at [facility], [address] [place], [place] [address]  Lipid panel     Status: Abnormal   Collection Time: 04/27/19  6:44 AM  Result Value Ref Range   Cholesterol 133 0 - 200 mg/dL   Triglycerides 342 <876 mg/dL   HDL 31 (L) >81 mg/dL   Total CHOL/HDL Ratio 4.3 RATIO   VLDL 22 0 - 40 mg/dL   LDL Cholesterol 80 0 - 99 mg/dL    Comment:        Total Cholesterol/HDL:CHD Risk Coronary Heart Disease Risk Table                     Men   Women  1/2 Average Risk   3.4   3.3  Average Risk       5.0   4.4  2 X Average Risk   9.6   7.1  3 X Average Risk  23.4   11.0        Use the calculated Patient Ratio above and the CHD Risk Table to determine the patient's CHD Risk.        ATP III CLASSIFICATION (LDL):  <100     mg/dL   Optimal  157-262  mg/dL   Near or Above  Optimal  130-159  mg/dL   Borderline  160-189  mg/dL   High  >190     mg/dL   Very High Performed at [facility] [address], [place], [place] [address]   TSH     Status:  None   Collection Time: 04/27/19  6:44 AM  Result Value Ref Range   TSH 2.386 0.350 - 4.500 uIU/mL    Comment: Performed by a 3rd Generation assay with a functional sensitivity of <=0.01 uIU/mL. Performed at [facility], [place] [address], [place], [place] [address]     Blood Alcohol level:  Lab Results  Component Value Date   ETH <10 80/99/8338    Metabolic Disorder Labs: Lab Results  Component Value Date   HGBA1C 5.6 04/27/2019   MPG 114.02 04/27/2019   No results found for: PROLACTIN Lab Results  Component Value Date   CHOL 133 04/27/2019   TRIG 108 04/27/2019   HDL 31 (L) 04/27/2019   CHOLHDL 4.3 04/27/2019   VLDL 22 04/27/2019   [place] 80 04/27/2019    Physical Findings: AIMS: Facial and Oral Movements Muscles of Facial Expression: None, normal Lips and Perioral Area: None, normal Jaw: None, normal Tongue: None, normal,Extremity Movements Upper (arms, wrists, hands, fingers): None, normal Lower (legs, knees, ankles, toes): None, normal, Trunk Movements Neck, shoulders, hips: None, normal, Overall Severity Severity of abnormal movements (highest score from questions above): None, normal Incapacitation due to abnormal movements: None, normal Patient's awareness of abnormal movements (rate only patient's report): No Awareness, Dental Status Current problems with teeth and/or dentures?: No Does patient usually wear dentures?: No  CIWA:    COWS:     Musculoskeletal: Strength & Muscle Tone: within normal limits Gait & Station: normal Patient leans: N/A  Psychiatric Specialty Exam: Physical Exam  Nursing note and vitals reviewed. Constitutional: He is oriented to person, place, and time. He appears well-developed and well-nourished.  HENT:  Head: Normocephalic and atraumatic.  Respiratory: Effort normal.  Neurological: He is alert and oriented to person, place, and time.    ROS  Blood pressure 121/83, pulse 96, temperature 97.7 F (36.5  C), temperature source Oral, resp. rate 16, height 6\' 1"  (1.854 m), weight 122.5 kg, SpO2 98 %.Body mass index is 35.62 kg/m.  General Appearance: Disheveled  Eye Contact:  Fair  Speech:  Pressured  Volume:  Increased  Mood:  Anxious, Depressed, Dysphoric and Irritable  Affect:  Congruent  Thought Process:  Coherent and Descriptions of Associations: Circumstantial  Orientation:  Full (Time, Place, and Person)  Thought Content:  Logical  Suicidal Thoughts:  Yes.  without intent/plan  Homicidal Thoughts:  No  Memory:  Immediate;   Fair Recent;   Fair Remote;   Fair  Judgement:  Impaired  Insight:  Lacking  Psychomotor Activity:  Increased  Concentration:  Concentration: Fair and Attention Span: Fair  Recall:  [employer] of Knowledge:  Fair  Language:  Fair  Akathisia:  Negative  Handed:  Right  AIMS (if indicated):     Assets:  Desire for Improvement Resilience  ADL's:  Intact  Cognition:  WNL  Sleep:  Number of Hours: 6     Treatment Plan Summary: Daily contact with patient to assess and evaluate symptoms and progress in treatment, Medication management and Plan : Patient is seen and examined.  Patient is a 28 year old male with the above-stated past psychiatric history who is seen in follow-up.  Diagnosis: #1 substance-induced mood disorder versus  severe episode of recurrent major depression, #2 cannabis use disorder, #3 recent onset suicidal ideation.  Patient is seen in follow-up.  He is essentially unchanged from admission.  He stated he was supposed to be taking lithium 750 mg p.o. twice daily, but we have been unable to confirm this.  We contacted his pharmacy, but they do not have a prescription for that.  In the meantime we will continue the 600 mg p.o. twice daily.  No other changes in his medications at this point.  We will try to get some collateral information. 1.  Continue Wellbutrin XL 150 mg p.o. daily for depression and anxiety. 2.  Continue lithium carbonate  600 mg p.o. twice daily for mood stability. 3.  Stop Naprosyn given drug interactions between nonsteroidals and lithium. 4.  Continue trazodone 50 mg p.o. nightly as needed insomnia. 5.  Disposition planning-in progress. Antonieta Pert, MD 04/28/2019, 1:55 PM

## 2019-04-28 NOTE — Progress Notes (Signed)
   04/28/19 1300  Psych Admission Type (Psych Patients Only)  Admission Status Voluntary  Psychosocial Assessment  Patient Complaints None  Eye Contact Intense  Facial Expression Angry  Affect Angry  Speech Logical/coherent  Interaction Minimal  Motor Activity Other (Comment) (WDL)  Appearance/Hygiene Disheveled  Behavior Characteristics Agitated  Mood Labile  Thought Process  Coherency Circumstantial  Content WDL  Delusions None reported or observed  Perception WDL  Hallucination None reported or observed  Judgment Poor  Confusion None  Danger to Self  Current suicidal ideation? Denies  Danger to Others  Danger to Others None reported or observed   Pt was irritable this morning refusing to get up and take morning medications. After multiple requests he came to the medication window. Pt's mood changed in the afternoon and he has been less irritable. Pt reports that his lt ear feels full/pressure. Pt was given tylenol and debrox has been ordered. He denies si and hi. Safety maintained on the unit.

## 2019-04-29 MED ORDER — LIDOCAINE 5 % EX PTCH
1.0000 | MEDICATED_PATCH | Freq: Every day | CUTANEOUS | Status: DC | PRN
  Administered 2019-04-29 – 2019-05-01 (×2): 1 via TRANSDERMAL
  Filled 2019-04-29 (×3): qty 1

## 2019-04-29 MED ORDER — NICOTINE 21 MG/24HR TD PT24
21.0000 mg | MEDICATED_PATCH | Freq: Every day | TRANSDERMAL | Status: DC
  Administered 2019-04-29 – 2019-04-30 (×2): 21 mg via TRANSDERMAL
  Filled 2019-04-29 (×4): qty 1

## 2019-04-29 NOTE — Plan of Care (Signed)
Admission Note  D: pt found in bed; compliant with medication administration. Pt approaches with complaints that staff "weren't concerned" with the pt's complaints upon awakening. Pt felt as though they were having a serious medical issue and didn't receive adequate care. Pt has complaints of nicotine withdrawal. Nicotine patch ordered. Pt is visibly anxious. Pt is pleasant. Pt denies si/hi/ah/vh and verbally agrees to approach staff if these become apparent or before harming themself/others while at [facility].  A: Pt provided support and encouragement. Pt given medication per protocol and standing orders. Q34m safety checks implemented and continued.  R: Pt safe on the unit. Will continue to monitor.  Pt progressing in the following metrics  Problem: Education: Goal: Knowledge of [facility] Education information/materials will improve Outcome: Progressing Goal: Emotional status will improve Outcome: Progressing Goal: Mental status will improve Outcome: Progressing Goal: Verbalization of understanding the information provided will improve Outcome: Progressing

## 2019-04-29 NOTE — Progress Notes (Signed)
Patient ID: [name] [name], male   DOB: 1991/05/14, 28 y.o.   MRN: [id number]   [place] NOVEL CORONAVIRUS (COVID-19) DAILY CHECK-OFF SYMPTOMS - answer yes or no to each - every day NO YES  Have you had a fever in the past 24 hours?  . Fever (Temp > 37.80C / 100F) X   Have you had any of these symptoms in the past 24 hours? . New Cough .  Sore Throat  .  Shortness of Breath .  Difficulty Breathing .  Unexplained Body Aches   X   Have you had any one of these symptoms in the past 24 hours not related to allergies?   . Runny Nose .  Nasal Congestion .  Sneezing   X   If you have had runny nose, nasal congestion, sneezing in the past 24 hours, has it worsened?  X   EXPOSURES - check yes or no X   Have you traveled outside the state in the past 14 days?  X   Have you been in contact with someone with a confirmed diagnosis of COVID-19 or PUI in the past 14 days without wearing appropriate PPE?  X   Have you been living in the same home as a person with confirmed diagnosis of COVID-19 or a PUI (household contact)?    X   Have you been diagnosed with COVID-19?    X              What to do next: Answered NO to all: Answered YES to anything:   Proceed with unit schedule Follow the [facility] Inpatient Flowsheet.

## 2019-04-29 NOTE — Progress Notes (Signed)
   04/29/19 2100  Psych Admission Type (Psych Patients Only)  Admission Status Voluntary  Psychosocial Assessment  Patient Complaints Anxiety  Eye Contact Vacant  Facial Expression Anxious;Pensive;Worried  Affect Anxious;Preoccupied  Speech Logical/coherent;Loud  Runner, broadcasting/film/video;Attention-seeking  Motor Activity Slow  Appearance/Hygiene Unremarkable  Behavior Characteristics Cooperative  Mood Anxious;Labile  Thought Process  Coherency Circumstantial  Content WDL  Delusions None reported or observed  Perception WDL  Hallucination None reported or observed  Judgment WDL  Confusion None  Danger to Self  Current suicidal ideation? Denies  Danger to Others  Danger to Others None reported or observed   Pt visible on unit, pt pleasant affect this evening

## 2019-04-29 NOTE — Progress Notes (Signed)
[facility] MD Progress Note  04/29/2019 1:05 PM [name] [name]  MRN:  [id number] Subjective:  Patient is a 28 year old male with a history of depression and cocaine use disorder who presented for voluntary treatment of depression on 04/27/2019 with suicidal ideation.  Objective: Patient is seen and examined.  Patient is seen in follow-up.  He is doing better today.  His irritability has decreased significantly.  He denied any suicidal or homicidal ideation.  He stated his withdrawal symptoms were improving.  He has already been in contact with the sober living house of [place] he was staying at, and his plan is to return there.  He denied any withdrawal problems today.  His sleep was improved.  We tried to figure out what his dosage of lithium was, and it was 600 mg twice daily.  That is his current dosage and he is happy with that.  He stated he has an MRI scheduled for Sunday, and some kind of physical therapy follow-up next week and is looking to be discharged tomorrow or Friday.  His vital signs are stable, he is afebrile.  He slept 6.25 hours last night.  He denied any suicidal ideation.  Principal Problem: <principal problem not specified> Diagnosis: Active Problems:   Substance induced mood disorder (HCC)   Severe episode of recurrent major depressive disorder, without psychotic features ([place])   Cannabis dependence ([place])  Total Time spent with patient: 20 minutes  Past Psychiatric History: See admission H&P  Past Medical History: History reviewed. No pertinent past medical history. History reviewed. No pertinent surgical history. Family History: History reviewed. No pertinent family history. Family Psychiatric  History: See admission H&P Social History:  Social History   Substance and Sexual Activity  Alcohol Use None     Social History   Substance and Sexual Activity  Drug Use Not on file    Social History   Socioeconomic History  . Marital status: Single    Spouse name: Not on  file  . Number of children: Not on file  . Years of education: Not on file  . Highest education level: Not on file  Occupational History  . Not on file  Social Needs  . Financial resource strain: Not on file  . Food insecurity    Worry: Not on file    Inability: Not on file  . Transportation needs    Medical: Not on file    Non-medical: Not on file  Tobacco Use  . Smoking status: Current Every Day Smoker    Types: Cigarettes  . Smokeless tobacco: Never Used  Substance and Sexual Activity  . Alcohol use: Not on file  . Drug use: Not on file  . Sexual activity: Not on file  Lifestyle  . Physical activity    Days per week: Not on file    Minutes per session: Not on file  . Stress: Not on file  Relationships  . Social Herbalist on phone: Not on file    Gets together: Not on file    Attends religious service: Not on file    Active member of club or organization: Not on file    Attends meetings of clubs or organizations: Not on file    Relationship status: Not on file  Other Topics Concern  . Not on file  Social History Narrative  . Not on file   Additional Social History:  Sleep: Good  Appetite:  Good  Current Medications: Current Facility-Administered Medications  Medication Dose Route Frequency Provider Last Rate Last Dose  . acetaminophen (TYLENOL) tablet 650 mg  650 mg Oral Q6H PRN Maryagnes AmosStarkes-Perry, Takia S, FNP   650 mg at 04/28/19 1314  . alum & mag hydroxide-simeth (MAALOX/MYLANTA) 200-200-20 MG/5ML suspension 30 mL  30 mL Oral Q4H PRN Maryagnes AmosStarkes-Perry, Takia S, FNP      . buPROPion (WELLBUTRIN XL) 24 hr tablet 150 mg  150 mg Oral Daily Cobos, Rockey SituFernando A, MD   150 mg at 04/29/19 0755  . carbamide peroxide (DEBROX) 6.5 % OTIC (EAR) solution 5 drop  5 drop Left EAR BID Cobos, Rockey SituFernando A, MD   5 drop at 04/28/19 1658  . lidocaine (LIDODERM) 5 % 1 patch  1 patch Transdermal Daily PRN Aldean BakerSykes, Janet E, NP   1 patch at 04/29/19  1252  . lithium carbonate capsule 600 mg  600 mg Oral BID WC Cobos, Rockey SituFernando A, MD   600 mg at 04/29/19 0755  . magnesium hydroxide (MILK OF MAGNESIA) suspension 30 mL  30 mL Oral Daily PRN Starkes-Perry, Juel Burrowakia S, FNP      . nicotine (NICODERM CQ - dosed in mg/24 hours) patch 21 mg  21 mg Transdermal Daily Antonieta Pertlary, Callia Swim Lawson, MD   21 mg at 04/29/19 1051  . traZODone (DESYREL) tablet 50 mg  50 mg Oral QHS PRN Cobos, Rockey SituFernando A, MD   50 mg at 04/28/19 2145    Lab Results: No results found for this or any previous visit (from the past 48 hour(s)).  Blood Alcohol level:  Lab Results  Component Value Date   ETH <10 04/26/2019    Metabolic Disorder Labs: Lab Results  Component Value Date   HGBA1C 5.6 04/27/2019   MPG 114.02 04/27/2019   No results found for: PROLACTIN Lab Results  Component Value Date   CHOL 133 04/27/2019   TRIG 108 04/27/2019   HDL 31 (L) 04/27/2019   CHOLHDL 4.3 04/27/2019   VLDL 22 04/27/2019   LDLCALC 80 04/27/2019    Physical Findings: AIMS: Facial and Oral Movements Muscles of Facial Expression: None, normal Lips and Perioral Area: None, normal Jaw: None, normal Tongue: None, normal,Extremity Movements Upper (arms, wrists, hands, fingers): None, normal Lower (legs, knees, ankles, toes): None, normal, Trunk Movements Neck, shoulders, hips: None, normal, Overall Severity Severity of abnormal movements (highest score from questions above): None, normal Incapacitation due to abnormal movements: None, normal Patient's awareness of abnormal movements (rate only patient's report): No Awareness, Dental Status Current problems with teeth and/or dentures?: No Does patient usually wear dentures?: No  CIWA:    COWS:     Musculoskeletal: Strength & Muscle Tone: within normal limits Gait & Station: normal Patient leans: N/A  Psychiatric Specialty Exam: Physical Exam  Nursing note and vitals reviewed. Constitutional: He is oriented to person, place, and  time. He appears well-developed and well-nourished.  HENT:  Head: Normocephalic and atraumatic.  Respiratory: Effort normal.  Neurological: He is alert and oriented to person, place, and time.    ROS  Blood pressure 121/83, pulse 96, temperature 97.7 F (36.5 C), temperature source Oral, resp. rate 16, height 6\' 1"  (1.854 m), weight 122.5 kg, SpO2 98 %.Body mass index is 35.62 kg/m.  General Appearance: Casual  Eye Contact:  Fair  Speech:  Normal Rate  Volume:  Increased  Mood:  Anxious and Irritable  Affect:  Congruent  Thought Process:  Coherent and Descriptions of Associations:  Intact  Orientation:  Full (Time, Place, and Person)  Thought Content:  Logical  Suicidal Thoughts:  No  Homicidal Thoughts:  No  Memory:  Immediate;   Fair Recent;   Fair Remote;   Fair  Judgement:  Intact  Insight:  Fair  Psychomotor Activity:  Increased  Concentration:  Concentration: Fair and Attention Span: Fair  Recall:  [employer] of Knowledge:  Fair  Language:  Good  Akathisia:  Negative  Handed:  Right  AIMS (if indicated):     Assets:  Desire for Improvement Resilience  ADL's:  Intact  Cognition:  WNL  Sleep:  Number of Hours: 6.25     Treatment Plan Summary: Daily contact with patient to assess and evaluate symptoms and progress in treatment, Medication management and Plan : Patient is seen and examined.  Patient is a 28 year old male with the above-stated past psychiatric history who is seen in follow-up.  Diagnosis: #1 substance-induced mood disorder versus severe episode of recurrent major depression versus bipolar disorder, most recently mixed #2 cannabis use disorder, #3 recent onset suicidal ideation.  Patient is much better than yesterday.  Most of that is probably because of restarting his lithium.  No change in his medications today.  We will hopefully be able to get him home tomorrow.  He will contact the sober living house of [place] and see if he can go there tomorrow,  and if necessary will wait till Friday.  I am going to go on and order a lithium level, TSH and chemistries for the a.m. tomorrow in case he can be discharged.  I suspect that he does have bipolar disorder.  1.  Continue Wellbutrin XL 150 mg p.o. daily for depression and anxiety. 2.  Continue lithium carbonate 600 mg p.o. twice daily for mood stability. 3.    Lithium level, TSH and chemistries in a.m. tomorrow 4.  Continue trazodone 50 mg p.o. nightly as needed insomnia. 5.  Disposition planning-in progress. Antonieta Pert, MD 04/29/2019, 1:05 PM

## 2019-04-29 NOTE — Plan of Care (Signed)
  Problem: Education: Goal: Mental status will improve Outcome: Progressing   Problem: Activity: Goal: Interest or engagement in activities will improve Outcome: Progressing   

## 2019-04-29 NOTE — [facility] Group Notes (Signed)
Type of Therapy and Topic: Group Therapy: Trust and Honesty  Participation Level: Active   Description of Group:  In this group patients will be asked to explore the value of being honest. Patients will be guided to discuss their thoughts, feelings, and behaviors related to honesty and trusting in others. Patients will process together how trust and honesty relate to forming relationships with peers, family members, and self. Each patient will be challenged to identify and express feelings of being vulnerable. Patients will discuss reasons why people are dishonest and identify alternative outcomes if one was truthful (to self or others). This group will be process-oriented, with patients participating in exploration of their own experiences, giving and receiving support, and processing challenge from other group members.  Therapeutic Goals: 1. Patient will identify why honesty is important to relationships and how honesty overall affects relationships. 2. Patient will identify a situation where they lied or were lied too and the feelings, thought process, and behaviors surrounding the situation 3. Patient will identify the meaning of being vulnerable, how that feels, and how that correlates to being honest with self and others. 4. Patient will identify situations where they could have told the truth, but instead lied and explain reasons of dishonesty.  Summary of Patient Progress:  [name] [name] was engaged and participated throughout the group session.  [name] [name] reports that when he is lied to he feels hurt. He also states that he learned to be dishonest from how he was raised as a child.    Therapeutic Modalities:  Cognitive Behavioral Therapy Solution Focused Therapy Motivational Interviewing Brief Therapy   Radonna Ricker, MSW, LCSW Clinical Social Worker [facility]  Phone: [phone]

## 2019-04-30 LAB — COMPREHENSIVE METABOLIC PANEL
ALT: 15 U/L (ref 0–44)
AST: 13 U/L — ABNORMAL LOW (ref 15–41)
Albumin: 4.2 g/dL (ref 3.5–5.0)
Alkaline Phosphatase: 48 U/L (ref 38–126)
Anion gap: 8 (ref 5–15)
BUN: 15 mg/dL (ref 6–20)
CO2: 27 mmol/L (ref 22–32)
Calcium: 9.4 mg/dL (ref 8.9–10.3)
Chloride: 103 mmol/L (ref 98–111)
Creatinine, Ser: 0.91 mg/dL (ref 0.61–1.24)
GFR calc Af Amer: >60 mL/min (ref >60)
GFR calc non Af Amer: >60 mL/min (ref >60)
Glucose, Bld: 137 mg/dL — ABNORMAL HIGH (ref 70–99)
Potassium: 4.2 mmol/L (ref 3.5–5.1)
Sodium: 138 mmol/L (ref 135–145)
Total Bilirubin: 1.1 mg/dL (ref 0.3–1.2)
Total Protein: 7.9 g/dL (ref 6.5–8.1)

## 2019-04-30 LAB — LITHIUM LEVEL: Lithium Lvl: 0.35 mmol/L — ABNORMAL LOW (ref 0.60–1.20)

## 2019-04-30 LAB — TSH: TSH: 3.075 u[IU]/mL (ref 0.350–4.500)

## 2019-04-30 MED ORDER — ARIPIPRAZOLE 10 MG PO TABS
10.0000 mg | ORAL_TABLET | Freq: Every day | ORAL | Status: DC
  Administered 2019-04-30 – 2019-05-01 (×2): 10 mg via ORAL
  Filled 2019-04-30 (×4): qty 1

## 2019-04-30 MED ORDER — LORAZEPAM 1 MG PO TABS
1.0000 mg | ORAL_TABLET | ORAL | Status: AC
  Administered 2019-04-30: 1 mg via ORAL
  Filled 2019-04-30: qty 1

## 2019-04-30 MED ORDER — NICOTINE POLACRILEX 2 MG MT GUM
2.0000 mg | CHEWING_GUM | OROMUCOSAL | Status: DC | PRN
  Administered 2019-04-30 – 2019-05-01 (×3): 2 mg via ORAL
  Filled 2019-04-30: qty 1

## 2019-04-30 NOTE — Progress Notes (Signed)
Patient was staring at the group chart on the wall this morning, talking to himself.  Patient was seen talking to himself several times this morning. Patient has been encouraged several times today to wear his facial mask. Patient did take his medications per MD orders.  Emotional support and encouragement given patient. Safety maintained with 15 minute checks.

## 2019-04-30 NOTE — Progress Notes (Signed)
[facility] MD Progress Note  04/30/2019 10:27 AM [name] [name]  MRN:  [id number] Subjective:  Patient is a 28 year old male with a history of depression and cocaine use disorder who presented for voluntary treatment of depression on 04/27/2019 with suicidal ideation.  Objective: Patient is seen and examined.  Patient is a 28 year old male with the above-stated past psychiatric history who is seen in follow-up.  His irritability had decreased yesterday, but was noted today that he was pacing more, and looks like he was responding to internal stimuli.  I noted the patient going to the back door the unit, and it appeared that he was talking to someone that he thought was right outside the door.  There is no one there.  Discussion with nursing also revealed that they had noticed that he was talking to himself more over the last 24 hours.  He did have a lithium level done this morning, and that is currently pending.  His TSH and electrolytes that came back were both normal.  He denied any suicidal ideation this morning.  He is very irritated over the fact that I want to keep him in another day.  He is focus purely on his MRI and getting his evaluation on his back.  We discussed antipsychotics he may have taken in the past.  He stated he had taken Abilify in the past.  He agreed to that now.  His vital signs are stable, he is afebrile.  He did sleep 6.25 hours last night.  Principal Problem: <principal problem not specified> Diagnosis: Active Problems:   Substance induced mood disorder (HCC)   Severe episode of recurrent major depressive disorder, without psychotic features ([place])   Cannabis dependence ([place])  Total Time spent with patient: 30 minutes  Past Psychiatric History: See admission H&P  Past Medical History: History reviewed. No pertinent past medical history. History reviewed. No pertinent surgical history. Family History: History reviewed. No pertinent family history. Family Psychiatric  History:  See admission H&P Social History:  Social History   Substance and Sexual Activity  Alcohol Use None     Social History   Substance and Sexual Activity  Drug Use Not on file    Social History   Socioeconomic History  . Marital status: Single    Spouse name: Not on file  . Number of children: Not on file  . Years of education: Not on file  . Highest education level: Not on file  Occupational History  . Not on file  Social Needs  . Financial resource strain: Not on file  . Food insecurity    Worry: Not on file    Inability: Not on file  . Transportation needs    Medical: Not on file    Non-medical: Not on file  Tobacco Use  . Smoking status: Current Every Day Smoker    Types: Cigarettes  . Smokeless tobacco: Never Used  Substance and Sexual Activity  . Alcohol use: Not on file  . Drug use: Not on file  . Sexual activity: Not on file  Lifestyle  . Physical activity    Days per week: Not on file    Minutes per session: Not on file  . Stress: Not on file  Relationships  . Social Herbalist on phone: Not on file    Gets together: Not on file    Attends religious service: Not on file    Active member of club or organization: Not on file    Attends  meetings of clubs or organizations: Not on file    Relationship status: Not on file  Other Topics Concern  . Not on file  Social History Narrative  . Not on file   Additional Social History:                         Sleep: Good  Appetite:  Good  Current Medications: Current Facility-Administered Medications  Medication Dose Route Frequency Provider Last Rate Last Dose  . acetaminophen (TYLENOL) tablet 650 mg  650 mg Oral Q6H PRN Maryagnes Amos, FNP   650 mg at 04/28/19 1314  . alum & mag hydroxide-simeth (MAALOX/MYLANTA) 200-200-20 MG/5ML suspension 30 mL  30 mL Oral Q4H PRN Rosario Adie, Juel Burrow, FNP      . ARIPiprazole (ABILIFY) tablet 10 mg  10 mg Oral Daily Antonieta Pert, MD    10 mg at 04/30/19 0911  . buPROPion (WELLBUTRIN XL) 24 hr tablet 150 mg  150 mg Oral Daily Cobos, Rockey Situ, MD   150 mg at 04/30/19 0749  . carbamide peroxide (DEBROX) 6.5 % OTIC (EAR) solution 5 drop  5 drop Left EAR BID Cobos, Rockey Situ, MD   5 drop at 04/28/19 1658  . lidocaine (LIDODERM) 5 % 1 patch  1 patch Transdermal Daily PRN Aldean Baker, NP   1 patch at 04/29/19 1252  . lithium carbonate capsule 600 mg  600 mg Oral BID WC Cobos, Rockey Situ, MD   600 mg at 04/30/19 0750  . magnesium hydroxide (MILK OF MAGNESIA) suspension 30 mL  30 mL Oral Daily PRN Starkes-Perry, Juel Burrow, FNP      . nicotine (NICODERM CQ - dosed in mg/24 hours) patch 21 mg  21 mg Transdermal Daily Antonieta Pert, MD   21 mg at 04/30/19 0751  . traZODone (DESYREL) tablet 50 mg  50 mg Oral QHS PRN Cobos, Rockey Situ, MD   50 mg at 04/29/19 2208    Lab Results:  Results for orders placed or performed during the hospital encounter of 04/26/19 (from the past 48 hour(s))  Lithium level     Status: Abnormal   Collection Time: 04/30/19  6:47 AM  Result Value Ref Range   Lithium Lvl 0.35 (L) 0.60 - 1.20 mmol/L    Comment: Performed at [facility], [address] [place], [place] [address]  TSH     Status: None   Collection Time: 04/30/19  6:47 AM  Result Value Ref Range   TSH 3.075 0.350 - 4.500 uIU/mL    Comment: Performed by a 3rd Generation assay with a functional sensitivity of <=0.01 uIU/mL. Performed at [facility], [address] [place], [place] [address]   Comprehensive metabolic panel     Status: Abnormal   Collection Time: 04/30/19  6:47 AM  Result Value Ref Range   Sodium 138 135 - 145 mmol/L   Potassium 4.2 3.5 - 5.1 mmol/L   Chloride 103 98 - 111 mmol/L   CO2 27 22 - 32 mmol/L   Glucose, Bld 137 (H) 70 - 99 mg/dL   BUN 15 6 - 20 mg/dL   Creatinine, Ser 0.98 0.61 - 1.24 mg/dL   Calcium 9.4 8.9 - 11.9 mg/dL   Total Protein 7.9 6.5 - 8.1 g/dL   Albumin  4.2 3.5 - 5.0 g/dL   AST 13 (L) 15 - 41 U/L   ALT 15 0 - 44 U/L   Alkaline  Phosphatase 48 38 - 126 U/L   Total Bilirubin 1.1 0.3 - 1.2 mg/dL   GFR calc non Af Amer >60 >60 mL/min   GFR calc Af Amer >60 >60 mL/min   Anion gap 8 5 - 15    Comment: Performed at [facility], [address] [place], [place] [id number]    Blood Alcohol level:  Lab Results  Component Value Date   ETH <10 04/26/2019    Metabolic Disorder Labs: Lab Results  Component Value Date   HGBA1C 5.6 04/27/2019   MPG 114.02 04/27/2019   No results found for: PROLACTIN Lab Results  Component Value Date   CHOL 133 04/27/2019   TRIG 108 04/27/2019   HDL 31 (L) 04/27/2019   CHOLHDL 4.3 04/27/2019   VLDL 22 04/27/2019   LDLCALC 80 04/27/2019    Physical Findings: AIMS: Facial and Oral Movements Muscles of Facial Expression: None, normal Lips and Perioral Area: None, normal Jaw: None, normal Tongue: None, normal,Extremity Movements Upper (arms, wrists, hands, fingers): None, normal Lower (legs, knees, ankles, toes): None, normal, Trunk Movements Neck, shoulders, hips: None, normal, Overall Severity Severity of abnormal movements (highest score from questions above): None, normal Incapacitation due to abnormal movements: None, normal Patient's awareness of abnormal movements (rate only patient's report): No Awareness, Dental Status Current problems with teeth and/or dentures?: No Does patient usually wear dentures?: No  CIWA:    COWS:     Musculoskeletal: Strength & Muscle Tone: within normal limits Gait & Station: normal Patient leans: N/A  Psychiatric Specialty Exam: Physical Exam  Nursing note and vitals reviewed. Constitutional: He is oriented to person, place, and time. He appears well-developed and well-nourished.  HENT:  Head: Normocephalic and atraumatic.  Respiratory: Effort normal.  Neurological: He is alert and oriented to person, place, and time.    ROS  Blood  pressure 125/87, pulse (!) 104, temperature (!) 97.5 F (36.4 C), temperature source Oral, resp. rate 16, height 6\' 1"  (1.854 m), weight 122.5 kg, SpO2 98 %.Body mass index is 35.62 kg/m.  General Appearance: Casual  Eye Contact:  Minimal  Speech:  Normal Rate  Volume:  Increased  Mood:  Dysphoric and Irritable  Affect:  Congruent  Thought Process:  Coherent and Descriptions of Associations: Circumstantial  Orientation:  Full (Time, Place, and Person)  Thought Content:  Hallucinations: Auditory  Suicidal Thoughts:  No  Homicidal Thoughts:  No  Memory:  Immediate;   Fair Recent;   Fair Remote;   Fair  Judgement:  Impaired  Insight:  Fair  Psychomotor Activity:  Increased  Concentration:  Concentration: Fair and Attention Span: Fair  Recall:  [employer]  Fund of Knowledge:  Fair  Language:  Good  Akathisia:  Negative  Handed:  Right  AIMS (if indicated):     Assets:  Desire for Improvement Resilience  ADL's:  Intact  Cognition:  WNL  Sleep:  Number of Hours: 6.25     Treatment Plan Summary: Daily contact with patient to assess and evaluate symptoms and progress in treatment, Medication management and Plan : Patient is seen and examined.  Patient is a 28 year old male with the above-stated past psychiatric history who is seen in follow-up.  Diagnosis: #1 bipolar disorder, most recently mixed #2 cannabis use disorder, #3 recent onset suicidal ideation.  Patient is seen in follow-up.  His irritability is down, but it does appear that he is having some psychotic symptoms.  I will add Abilify 10 mg p.o. daily with first  dose now as well as 1 mg Ativan right now to try and ease him back.  His TSH and electrolytes were normal, and his lithium level today was only 0.35.  It was checked a little early given that we were going to discharge him today, but he has agreed to stay 1 more day, and on when to try and get that Abilify in his system.  If he shows improvement we can hopefully get him  home tomorrow so that he does not obsess about this MRI that is scheduled for Sunday.  No other changes in his current medications.  1. Continue Wellbutrin XL 150 mg p.o. daily for depression and anxiety. 2. Continue lithium carbonate 600 mg p.o. twice daily for mood stability. 3.  Start Abilify 10 mg p.o. daily, first dose now. 4. Continue trazodone 50 mg p.o. nightly as needed insomnia. 5. Disposition planning-in progress. Antonieta Pert, MD 04/30/2019, 10:27 AM

## 2019-04-30 NOTE — [facility] Group Notes (Signed)
Adult Psychoeducational Group Note  Date:  04/30/2019 Time:  10:29 PM  Group Topic/Focus:  Wrap-Up Group:   The focus of this group is to help patients review their daily goal of treatment and discuss progress on daily workbooks.  Participation Level:  Active  Participation Quality:  Inattentive  Affect:  Appropriate  Cognitive:  Lacking  Insight: None  Engagement in Group:  Distracting  Modes of Intervention:  Discussion and Education  Additional Comments:  Pt attended and participated in wrap up group this evening and rated their day a 10/10. Pt is planning on leaving tomorrow. Pt completed their goal, which was a discharge plan.   Cristi Loron 04/30/2019, 10:29 PM

## 2019-05-01 MED ORDER — LIDOCAINE 5 % EX PTCH: 1.0000 | MEDICATED_PATCH | Freq: Every day | CUTANEOUS | 0 refills | Status: DC | PRN

## 2019-05-01 MED ORDER — NICOTINE POLACRILEX 2 MG MT GUM: 2.0000 mg | CHEWING_GUM | OROMUCOSAL | 0 refills | Status: AC | PRN

## 2019-05-01 MED ORDER — TRAZODONE HCL 50 MG PO TABS: 50.0000 mg | ORAL_TABLET | Freq: Every evening | ORAL | 0 refills | Status: AC | PRN

## 2019-05-01 MED ORDER — LITHIUM CARBONATE 600 MG PO CAPS: 600.0000 mg | ORAL_CAPSULE | Freq: Two times a day (BID) | ORAL | 0 refills | Status: DC

## 2019-05-01 MED ORDER — CARBAMIDE PEROXIDE 6.5 % OT SOLN: 5.0000 [drp] | Freq: Two times a day (BID) | OTIC | 0 refills | Status: AC

## 2019-05-01 MED ORDER — BUPROPION HCL ER (XL) 150 MG PO TB24: 150.0000 mg | ORAL_TABLET | Freq: Every day | ORAL | 0 refills | Status: AC

## 2019-05-01 MED ORDER — ARIPIPRAZOLE 10 MG PO TABS: 10.0000 mg | ORAL_TABLET | Freq: Every day | ORAL | 0 refills | Status: AC

## 2019-05-01 MED ORDER — ARIPIPRAZOLE 10 MG PO TABS: 10.0000 mg | ORAL_TABLET | Freq: Every day | ORAL | Status: DC

## 2019-05-01 NOTE — Plan of Care (Signed)
Discharge note  Patient verbalizes readiness for discharge. Follow up plan explained, AVS, Transition record and SRA given. Prescriptions and teaching provided. Belongings returned and signed for. Suicide safety plan completed and signed. Patient verbalizes understanding. Patient denies SI/HI and assures this Probation officer he will seek assistance should that change. Patient discharged to lobby where driver was waiting to take pt to [facility] [place].  Problem: Education: Goal: Knowledge of [facility] Education information/materials will improve Outcome: Adequate for Discharge Goal: Emotional status will improve Outcome: Adequate for Discharge Goal: Mental status will improve Outcome: Adequate for Discharge Goal: Verbalization of understanding the information provided will improve Outcome: Adequate for Discharge   Problem: Activity: Goal: Interest or engagement in activities will improve Outcome: Adequate for Discharge Goal: Sleeping patterns will improve Outcome: Adequate for Discharge   Problem: Coping: Goal: Ability to verbalize frustrations and anger appropriately will improve Outcome: Adequate for Discharge Goal: Ability to demonstrate self-control will improve Outcome: Adequate for Discharge   Problem: Health Behavior/Discharge Planning: Goal: Identification of resources available to assist in meeting health care needs will improve Outcome: Adequate for Discharge Goal: Compliance with treatment plan for underlying cause of condition will improve Outcome: Adequate for Discharge   Problem: Physical Regulation: Goal: Ability to maintain clinical measurements within normal limits will improve Outcome: Adequate for Discharge   Problem: Safety: Goal: Periods of time without injury will increase Outcome: Adequate for Discharge   Problem: Education: Goal: Utilization of techniques to improve thought processes will improve Outcome: Adequate for Discharge Goal: Knowledge  of the prescribed therapeutic regimen will improve Outcome: Adequate for Discharge   Problem: Activity: Goal: Interest or engagement in leisure activities will improve Outcome: Adequate for Discharge Goal: Imbalance in normal sleep/wake cycle will improve Outcome: Adequate for Discharge   Problem: Coping: Goal: Coping ability will improve Outcome: Adequate for Discharge Goal: Will verbalize feelings Outcome: Adequate for Discharge   Problem: Health Behavior/Discharge Planning: Goal: Ability to make decisions will improve Outcome: Adequate for Discharge Goal: Compliance with therapeutic regimen will improve Outcome: Adequate for Discharge   Problem: Role Relationship: Goal: Will demonstrate positive changes in social behaviors and relationships Outcome: Adequate for Discharge   Problem: Safety: Goal: Ability to disclose and discuss suicidal ideas will improve Outcome: Adequate for Discharge Goal: Ability to identify and utilize support systems that promote safety will improve Outcome: Adequate for Discharge   Problem: Self-Concept: Goal: Will verbalize positive feelings about self Outcome: Adequate for Discharge Goal: Level of anxiety will decrease Outcome: Adequate for Discharge

## 2019-05-01 NOTE — Progress Notes (Signed)
   05/01/19 0000  Psych Admission Type (Psych Patients Only)  Admission Status Voluntary  Psychosocial Assessment  Patient Complaints Anxiety  Eye Contact Brief  Facial Expression Anxious  Affect Preoccupied;Anxious  Speech Logical/coherent  Interaction Assertive;Attention-seeking  Motor Activity Restless  Appearance/Hygiene Unremarkable  Behavior Characteristics Cooperative  Mood Anxious  Thought Process  Coherency WDL  Content Blaming others  Delusions None reported or observed  Perception WDL  Hallucination None reported or observed  Judgment WDL  Confusion None  Danger to Self  Current suicidal ideation? Denies  Danger to Others  Danger to Others None reported or observed

## 2019-05-01 NOTE — Progress Notes (Signed)
  [facility] Adult Case Management Discharge Plan :  Will you be returning to the same living situation after discharge:  Yes,  patient is returning to [place] At discharge, do you have transportation home?: Yes,  a representative of [place] will pick the patient up Do you have the ability to pay for your medications: Yes,  Medicaid   Release of information consent forms completed and in the chart;  Patient's signature needed at discharge.  Patient to Follow up at: Follow-up Information    [facility] Follow up on 05/06/2019.   Why: You are scheduled for a telephone appointment on Wednesday, December 9th at [place] will receive a phone call at [phone]. Contact information: [address] [place] [address] [phone]           Next level of care provider has access to [place] and Suicide Prevention discussed: Yes,  with the patient      Has patient been referred to the Quitline?: Patient refused referral  Patient has been referred for addiction treatment: Pt. refused referral  Marylee Floras, [place] 05/01/2019, 8:52 AM

## 2019-05-01 NOTE — Tx Team (Signed)
Interdisciplinary Treatment and Diagnostic Plan Update  05/01/2019 Time of Session: 10:00am [name] MRN: [id number]  Principal Diagnosis: Severe episode of recurrent major depressive disorder, without psychotic features ([place])  Secondary Diagnoses: Principal Problem:   Severe episode of recurrent major depressive disorder, without psychotic features ([place]) Active Problems:   Substance induced mood disorder ([place])   Cannabis dependence ([place])   Current Medications:  Current Facility-Administered Medications  Medication Dose Route Frequency Provider Last Rate Last Dose  . acetaminophen (TYLENOL) tablet 650 mg  650 mg Oral Q6H PRN Suella Broad, FNP   650 mg at 04/28/19 1314  . alum & mag hydroxide-simeth (MAALOX/MYLANTA) 200-200-20 MG/5ML suspension 30 mL  30 mL Oral Q4H PRN Burt Ek, Gayland Curry, FNP      . [START ON 05/02/2019] ARIPiprazole (ABILIFY) tablet 10 mg  10 mg Oral QHS Sharma Covert, MD      . buPROPion (WELLBUTRIN XL) 24 hr tablet 150 mg  150 mg Oral Daily Cobos, Myer Peer, MD   150 mg at 05/01/19 0807  . carbamide peroxide (DEBROX) 6.5 % OTIC (EAR) solution 5 drop  5 drop Left EAR BID Cobos, Myer Peer, MD   5 drop at 04/28/19 1658  . lidocaine (LIDODERM) 5 % 1 patch  1 patch Transdermal Daily PRN Connye Burkitt, NP   1 patch at 05/01/19 [phone]  . lithium carbonate capsule 600 mg  600 mg Oral BID WC Cobos, Myer Peer, MD   600 mg at 05/01/19 0807  . magnesium hydroxide (MILK OF MAGNESIA) suspension 30 mL  30 mL Oral Daily PRN Suella Broad, FNP      . nicotine polacrilex (NICORETTE) gum 2 mg  2 mg Oral PRN Sharma Covert, MD   2 mg at 05/01/19 6147  . traZODone (DESYREL) tablet 50 mg  50 mg Oral QHS PRN Cobos, Myer Peer, MD   50 mg at 04/30/19 2218   PTA Medications: Medications Prior to Admission  Medication Sig Dispense Refill Last Dose  . naproxen (NAPROSYN) 500 MG tablet Take 1 tablet (500 mg total) by mouth 2 (two) times daily. 20 tablet 0    . [DISCONTINUED] lithium 600 MG capsule Take 600 mg by mouth 2 (two) times daily with a meal.       Patient Stressors: Loss of father Medication change or noncompliance Other: Suicidal ideation  Patient Strengths: Average or above average intelligence Capable of independent living Communication skills Motivation for treatment/growth Physical Health  Treatment Modalities: Medication Management, Group therapy, Case management,  1 to 1 session with clinician, Psychoeducation, Recreational therapy.   Physician Treatment Plan for Primary Diagnosis: Severe episode of recurrent major depressive disorder, without psychotic features ([place]) Long Term Goal(s): Improvement in symptoms so as ready for discharge Improvement in symptoms so as ready for discharge   Short Term Goals: Ability to identify changes in lifestyle to reduce recurrence of condition will improve Ability to verbalize feelings will improve Ability to disclose and discuss suicidal ideas Ability to demonstrate self-control will improve Ability to identify and develop effective coping behaviors will improve  Medication Management: Evaluate patient's response, side effects, and tolerance of medication regimen.  Therapeutic Interventions: 1 to 1 sessions, Unit Group sessions and Medication administration.  Evaluation of Outcomes: Adequate for Discharge  Physician Treatment Plan for Secondary Diagnosis: Principal Problem:   Severe episode of recurrent major depressive disorder, without psychotic features ([place]) Active Problems:   Substance induced mood disorder ([place])   Cannabis dependence ([place])  Long Term  Goal(s): Improvement in symptoms so as ready for discharge Improvement in symptoms so as ready for discharge   Short Term Goals: Ability to identify changes in lifestyle to reduce recurrence of condition will improve Ability to verbalize feelings will improve Ability to disclose and discuss suicidal ideas Ability to  demonstrate self-control will improve Ability to identify and develop effective coping behaviors will improve     Medication Management: Evaluate patient's response, side effects, and tolerance of medication regimen.  Therapeutic Interventions: 1 to 1 sessions, Unit Group sessions and Medication administration.  Evaluation of Outcomes: Adequate for Discharge   RN Treatment Plan for Primary Diagnosis: Severe episode of recurrent major depressive disorder, without psychotic features ([place]) Long Term Goal(s): Knowledge of disease and therapeutic regimen to maintain health will improve  Short Term Goals: Ability to participate in decision making will improve, Ability to verbalize feelings will improve, Ability to disclose and discuss suicidal ideas, Ability to identify and develop effective coping behaviors will improve and Compliance with prescribed medications will improve  Medication Management: RN will administer medications as ordered by provider, will assess and evaluate patient's response and provide education to patient for prescribed medication. RN will report any adverse and/or side effects to prescribing provider.  Therapeutic Interventions: 1 on 1 counseling sessions, Psychoeducation, Medication administration, Evaluate responses to treatment, Monitor vital signs and CBGs as ordered, Perform/monitor CIWA, COWS, AIMS and Fall Risk screenings as ordered, Perform wound care treatments as ordered.  Evaluation of Outcomes: Adequate for Discharge   LCSW Treatment Plan for Primary Diagnosis: Severe episode of recurrent major depressive disorder, without psychotic features ([place]) Long Term Goal(s): Safe transition to appropriate next level of care at discharge, Engage patient in therapeutic group addressing interpersonal concerns.  Short Term Goals: Engage patient in aftercare planning with referrals and resources  Therapeutic Interventions: Assess for all discharge needs, 1 to 1 time with  Social worker, Explore available resources and support systems, Assess for adequacy in community support network, Educate family and significant other(s) on suicide prevention, Complete Psychosocial Assessment, Interpersonal group therapy.  Evaluation of Outcomes: Not Met   Progress in Treatment: Attending groups: Yes Participating in groups: Yes. Taking medication as prescribed: Yes. Toleration medication: Yes. Family/Significant other contact made: No, will contact:  no one, patient declined consent  Patient understands diagnosis: Yes. Discussing patient identified problems/goals with staff: Yes. Medical problems stabilized or resolved: Yes. Denies suicidal/homicidal ideation: No. Issues/concerns per patient self-inventory: No. Other:   New problem(s) identified: None   New Short Term/Long Term Goal(s): Detox, medication stabilization, elimination of SI thoughts, development of comprehensive mental wellness plan.    Patient Goals:  "Get back on the right medicine"   Discharge Plan or Barriers: Patient is returning to [place] for their substance use program. He will follow up with [facility] for outpatient medication management and therapy services.    Reason for Continuation of Hospitalization:   Estimated Length of Stay: Friday, 05/01/19  Attendees: Patient: [name] [name]  05/01/2019 9:47 AM  Physician: Dr. Neita Garnet, MD; Dr. Myles Lipps, MD 05/01/2019 9:47 AM  Nursing: Rise Paganini.Raliegh Ip, RN 05/01/2019 9:47 AM  RN Care Manager: 05/01/2019 9:47 AM  Social Worker: Radonna Ricker, LCSW 05/01/2019 9:47 AM  Recreational Therapist:  05/01/2019 9:47 AM  Other: Harriett Sine, NP 05/01/2019 9:47 AM  Other:  05/01/2019 9:47 AM  Other: 05/01/2019 9:47 AM    Scribe for Treatment Team: Marylee Floras, [place] 05/01/2019 9:47 AM

## 2019-05-01 NOTE — Discharge Summary (Signed)
Physician Discharge Summary Note  Patient:  [name] [name] is an 28 y.o., male  MRN:  [id number]  DOB:  1990-11-11  Patient phone:  [phone] (home)   Patient address:   [address] [place] [address],   Total Time spent with patient: Greater than 30 minutes  Date of Admission:  04/26/2019  Date of Discharge: 05-01-19  Reason for Admission: Worsening depression & passive suicidal ideations.  Principal Problem: Severe episode of recurrent major depressive disorder, without psychotic features [facility])  Discharge Diagnoses: Principal Problem:   Severe episode of recurrent major depressive disorder, without psychotic features ([place]) Active Problems:   Cannabis dependence ([place])   Substance induced mood disorder ([place])  Past Psychiatric History: Major depressive disorder, Cannabis use disorder.  Past Medical History: History reviewed. No pertinent past medical history. History reviewed. No pertinent surgical history.  Family History: History reviewed. No pertinent family history.  Family Psychiatric  History: See H&P  Social History:  Social History   Substance and Sexual Activity  Alcohol Use None     Social History   Substance and Sexual Activity  Drug Use Not on file    Social History   Socioeconomic History  . Marital status: Single    Spouse name: Not on file  . Number of children: Not on file  . Years of education: Not on file  . Highest education level: Not on file  Occupational History  . Not on file  Social Needs  . Financial resource strain: Not on file  . Food insecurity    Worry: Not on file    Inability: Not on file  . Transportation needs    Medical: Not on file    Non-medical: Not on file  Tobacco Use  . Smoking status: Current Every Day Smoker    Types: Cigarettes  . Smokeless tobacco: Never Used  Substance and Sexual Activity  . Alcohol use: Not on file  . Drug use: Not on file  . Sexual activity: Not on file  Lifestyle  .  Physical activity    Days per week: Not on file    Minutes per session: Not on file  . Stress: Not on file  Relationships  . Social Herbalist on phone: Not on file    Gets together: Not on file    Attends religious service: Not on file    Active member of club or organization: Not on file    Attends meetings of clubs or organizations: Not on file    Relationship status: Not on file  Other Topics Concern  . Not on file  Social History Narrative  . Not on file   Hospital Course: (Per Md's admission evaluation): 28, no children, currently unemployed, lives at [facility] ( [facility] [place]). Patient presented to ED voluntarily on 11/29 reporting suicidal ideations, which he describes as passive. Reports he has been depressed for several months , following death of his father earlier this year. Denies psychotic symptoms. Endorses some neuro-vegetative symptoms of depression, mainly some anhedonia, low energy level. Denies medical illnesses, NKDA. Denies history of seizures or of head trauma. Home medication list - Lithium 600 mgrs BID. Admission Li level 0.27. History of several prior psychiatric admissions, initially at age 72. Most recently was admitted to [facility] about a month ago for depression and suicidal ideations, self cutting . Reports history of intermittent self cutting.  Reports prior history of Bipolar Disorder and ADHD diagnosis. Of note, currently does not endorse  any clear history of mania or hypomania and emphasizes history of depressive episodes . Denies history of psychosis.Does not endorse history of PTSD. Denies history of Panic / Agoraphobia. Denies history of violence.  After the above admission evaluation, patient's presenting symptoms were noted. He was recommended for mood stabilization treatments. Then, the medication regimen targeting those presenting symptoms were discussed with him & initiated with his consent. He was medicated, stabilized & discharged on the  medications as listed on his discharge medication lists below. Besides the mood stabilization treatments, [name] [name] was also enrolled & participated in the group counseling sessions being offered & held on this unit. He learned coping skills. He also presented other significant pre-existing medical issues that required treatment. He was treated & discharged on all his pertinent medications for those health issues.  [name] [name]'s symptoms responded well to his treatment regimen. His symptoms has subsided & mood stable. Patient has met the maximum benefit of his hospitalization. He is currently mentally & medically stable to continue mental health care & medication management on an outpatient basis as noted below. He is provided with all the necessary information needed to make this appointment without problems.   Upon discharge, [name] [name] adamantly denies any suicidal/homicidal ideations, auditory/visual hallucinations delusional thinking, paranoia or substance withdrawal symptoms. He was able to engage in safety planning including plan to return to [facility] or contact emergency services if he feels unable to maintain his own safety or the safety of others. Pt had no further questions, comments, or concerns He left [facility] with all personal belongings in no apparent distress. Transportation per the [facility] [place] staff.  Physical Findings: AIMS: Facial and Oral Movements Muscles of Facial Expression: None, normal Lips and Perioral Area: None, normal Jaw: None, normal Tongue: None, normal,Extremity Movements Upper (arms, wrists, hands, fingers): None, normal Lower (legs, knees, ankles, toes): None, normal, Trunk Movements Neck, shoulders, hips: None, normal, Overall Severity Severity of abnormal movements (highest score from questions above): None, normal Incapacitation due to abnormal movements: None, normal Patient's awareness of abnormal movements (rate only patient's report): No Awareness, Dental  Status Current problems with teeth and/or dentures?: No Does patient usually wear dentures?: No  CIWA:    COWS:     Musculoskeletal: Strength & Muscle Tone: within normal limits Gait & Station: normal Patient leans: N/A  Psychiatric Specialty Exam: Physical Exam  Nursing note and vitals reviewed. Constitutional: He is oriented to person, place, and time. He appears well-developed.  Neck: Normal range of motion.  Cardiovascular:  Elevated pulse rate: 117.  Patient is in no apparent distress.  Respiratory: Effort normal.  Genitourinary:    Genitourinary Comments: Deferred   Musculoskeletal: Normal range of motion.  Neurological: He is alert and oriented to person, place, and time.  Skin: Skin is warm and dry.    Review of Systems  Constitutional: Negative for chills and fever.  Respiratory: Negative for cough, shortness of breath and wheezing.   Cardiovascular: Negative for chest pain and palpitations.  Gastrointestinal: Negative for abdominal pain, heartburn, nausea and vomiting.  Musculoskeletal: Negative for myalgias.  Skin: Negative.   Neurological: Negative for dizziness and headaches.  Psychiatric/Behavioral: Positive for depression (Stabilized with medication prior to discharge) and substance abuse (Hx. THC use disorder). Negative for hallucinations, memory loss and suicidal ideas. The patient has insomnia (Stabilized with medication prior to discharge). The patient is not nervous/anxious (Stable).     Blood pressure 129/84, pulse (!) 117, temperature (!) 97.5 F (36.4 C), temperature source Oral, resp.  rate 16, height '6\' 1"'  (1.854 m), weight 122.5 kg, SpO2 98 %.Body mass index is 35.62 kg/m.  See Md's discharge SRA     Has this patient used any form of tobacco in the last 30 days? (Cigarettes, Smokeless Tobacco, Cigars, and/or Pipes): Yes, an FDA-approved tobacco cessation medication was recommended at discharge.  Blood Alcohol level:  Lab Results  Component Value  Date   ETH <10 12/87/8676   Metabolic Disorder Labs:  Lab Results  Component Value Date   HGBA1C 5.6 04/27/2019   MPG 114.02 04/27/2019   No results found for: PROLACTIN Lab Results  Component Value Date   CHOL 133 04/27/2019   TRIG 108 04/27/2019   HDL 31 (L) 04/27/2019   CHOLHDL 4.3 04/27/2019   VLDL 22 04/27/2019   [place] 80 04/27/2019   See Psychiatric Specialty Exam and Suicide Risk Assessment completed by Attending Physician prior to discharge.  Discharge destination:  Home  Is patient on multiple antipsychotic therapies at discharge:  No   Has Patient had three or more failed trials of antipsychotic monotherapy by history:  No  Recommended Plan for Multiple Antipsychotic Therapies: NA  Allergies as of 05/01/2019   No Known Allergies     Medication List    STOP taking these medications   naproxen 500 MG tablet Commonly known as: NAPROSYN     TAKE these medications     Indication  ARIPiprazole 10 MG tablet Commonly known as: ABILIFY Take 1 tablet (10 mg total) by mouth at bedtime. For mood control Start taking on: May 02, 2019  Indication: Mood control   buPROPion 150 MG 24 hr tablet Commonly known as: WELLBUTRIN XL Take 1 tablet (150 mg total) by mouth daily. For depression Start taking on: May 02, 2019  Indication: Major Depressive Disorder   carbamide peroxide 6.5 % OTIC solution Commonly known as: DEBROX Place 5 drops into the left ear 2 (two) times daily. For ear wax removal  Indication: Removal of Wax From the Ear   lidocaine 5 % Commonly known as: LIDODERM Place 1 patch onto the skin daily as needed. Remove & Discard patch within 12 hours or as directed by MD: For pain  Indication: Nerve Pain After Herpes Zoster or Shingles   lithium 600 MG capsule Take 1 capsule (600 mg total) by mouth 2 (two) times daily with a meal. For mood stabilization What changed: additional instructions  Indication: Mood stabilization   nicotine  polacrilex 2 MG gum Commonly known as: NICORETTE Take 1 each (2 mg total) by mouth as needed. For smoking cessation  Indication: Nicotine Addiction   traZODone 50 MG tablet Commonly known as: DESYREL Take 1 tablet (50 mg total) by mouth at bedtime as needed for sleep.  Indication: Trouble Sleeping      Follow-up Information    [facility] Follow up on 05/06/2019.   Why: You are scheduled for a telephone appointment on Wednesday, December 9th at [place] will receive a phone call at [phone]. Contact information: [address] [place] [address] [phone]          Follow-up recommendations: Activity:  As tolerated Diet: As recommended by your primary care doctor. Keep all scheduled follow-up appointments as recommended.    Comments: Prescriptions given at discharge.  Patient agreeable to plan.  Given opportunity to ask questions.  Appears to feel comfortable with discharge denies any current suicidal or homicidal thought. Patient is also instructed prior to discharge to: Take all medications as prescribed  by his/her mental healthcare provider. Report any adverse effects and or reactions from the medicines to his/her outpatient provider promptly. Patient has been instructed & cautioned: To not engage in alcohol and or illegal drug use while on prescription medicines. In the event of worsening symptoms, patient is instructed to call the crisis hotline, 911 and or go to the nearest ED for appropriate evaluation and treatment of symptoms. To follow-up with his/her primary care provider for your other medical issues, concerns and or health care needs.  Signed: [name], NP, PMHNP, FNP-BC 05/01/2019, 8:52 AM

## 2019-05-01 NOTE — BHH Suicide Risk Assessment (Signed)
[facility] Discharge Suicide Risk Assessment   Principal Problem: <principal problem not specified> Discharge Diagnoses: Active Problems:   Substance induced mood disorder (HCC)   Severe episode of recurrent major depressive disorder, without psychotic features ([place])   Cannabis dependence ([place])   Total Time spent with patient: 15 minutes  Musculoskeletal: Strength & Muscle Tone: within normal limits Gait & Station: normal Patient leans: N/A  Psychiatric Specialty Exam: Review of Systems  Musculoskeletal: Positive for back pain.  All other systems reviewed and are negative.   Blood pressure 129/84, pulse (!) 117, temperature (!) 97.5 F (36.4 C), temperature source Oral, resp. rate 16, height 6\' 1"  (1.854 m), weight 122.5 kg, SpO2 98 %.Body mass index is 35.62 kg/m.  General Appearance: Casual  Eye Contact::  Fair  Speech:  Normal Rate409  Volume:  Normal  Mood:  Euthymic  Affect:  Congruent  Thought Process:  Coherent and Descriptions of Associations: Intact  Orientation:  Full (Time, Place, and Person)  Thought Content:  Logical  Suicidal Thoughts:  No  Homicidal Thoughts:  No  Memory:  Immediate;   Fair Recent;   Fair Remote;   Fair  Judgement:  Intact  Insight:  Fair  Psychomotor Activity:  Normal  Concentration:  Fair  Recall:  [employer] of Knowledge:Fair  Language: Good  Akathisia:  Negative  Handed:  Right  AIMS (if indicated):     Assets:  Desire for Improvement Resilience  Sleep:  Number of Hours: 5.5  Cognition: WNL  ADL's:  Intact   Mental Status Per Nursing Assessment::   On Admission:  Suicidal ideation indicated by patient  Demographic Factors:  Male, Divorced or widowed, Caucasian and Low socioeconomic status  Loss Factors: NA  Historical Factors: Impulsivity  Risk Reduction Factors:   Living with another person, especially a relative  Continued Clinical Symptoms:  Bipolar Disorder:   Depressive phase Alcohol/Substance  Abuse/Dependencies  Cognitive Features That Contribute To Risk:  None    Suicide Risk:  Minimal: No identifiable suicidal ideation.  Patients presenting with no risk factors but with morbid ruminations; may be classified as minimal risk based on the severity of the depressive symptoms  Follow-up Information    [facility] Follow up on 05/06/2019.   Why: You are scheduled for a telephone appointment on Wednesday, December 9th at [facility] will receive a phone call at [phone]. Contact information: [address] [place] [address] [phone]           Plan Of Care/Follow-up recommendations:  Activity:  ad lib  Sharma Covert, MD 05/01/2019, 8:16 AM

## 2019-05-03 ENCOUNTER — Other Ambulatory Visit: Payer: Self-pay | Admitting: Diagnostic Radiology

## 2019-05-03 ENCOUNTER — Other Ambulatory Visit: Payer: Self-pay

## 2019-05-03 ENCOUNTER — Ambulatory Visit
Admission: RE | Admit: 2019-05-03 | Discharge: 2019-05-03 | Disposition: A | Discharge status: Home / Self Care | Payer: Medicaid Other | Source: Ambulatory Visit | Attending: Diagnostic Radiology | Admitting: Diagnostic Radiology

## 2019-05-03 ENCOUNTER — Ambulatory Visit
Admission: RE | Admit: 2019-05-03 | Discharge: 2019-05-03 | Disposition: A | Discharge status: Home / Self Care | Payer: Medicaid Other | Source: Ambulatory Visit | Attending: Family Medicine | Admitting: Family Medicine

## 2019-05-03 DIAGNOSIS — M545 Low back pain, unspecified: Secondary | ICD-10-CM

## 2019-05-03 DIAGNOSIS — M795 Residual foreign body in soft tissue: Secondary | ICD-10-CM

## 2019-05-04 ENCOUNTER — Ambulatory Visit: Payer: Medicaid Other | Attending: Family Medicine | Admitting: Physical Therapy

## 2019-05-04 ENCOUNTER — Encounter: Payer: Self-pay | Admitting: Physical Therapy

## 2019-05-04 DIAGNOSIS — M79651 Pain in right thigh: Secondary | ICD-10-CM | POA: Insufficient documentation

## 2019-05-04 DIAGNOSIS — M5416 Radiculopathy, lumbar region: Secondary | ICD-10-CM | POA: Diagnosis present

## 2019-05-04 NOTE — Therapy (Signed)
[facility] [address] [place], [place], [address] Phone: [phone]   Fax:  [phone]  Physical Therapy Treatment  Patient Details  Name: [name] [name] MRN: [id number] Date of Birth: Feb 27, 1991 Referring Provider (PT): Otho Darner, MD   Encounter Date: 05/04/2019  PT End of Session - 05/04/19 1058    Visit Number  2    Date for PT Re-Evaluation  05/15/19    Authorization Type  MCD    Authorization Time Period  12/6-12/26    Authorization - Visit Number  1    Authorization - Number of Visits  3    PT Start Time  1059    PT Stop Time  1156    PT Time Calculation (min)  57 min    Activity Tolerance  Patient tolerated treatment well    Behavior During Therapy  [facility] for tasks assessed/performed       History reviewed. No pertinent past medical history.  History reviewed. No pertinent surgical history.  There were no vitals filed for this visit.  Subjective Assessment - 05/04/19 1100    Subjective  Not hurting as bad as it was. Most pain in lateral Rt hip    Currently in Pain?  Yes    Pain Score  5     Pain Location  Hip    Pain Orientation  Right;Lateral    Pain Descriptors / Indicators  Stabbing                       [facility] Adult PT Treatment/Exercise - 05/04/19 0001      Knee/Hip Exercises: Stretches   Active Hamstring Stretch Limitations  hands behind thigh    Passive Hamstring Stretch Limitations  seated edge of chair 30s each x2    Other Knee/Hip Stretches  phsyioball rollout flexion      Knee/Hip Exercises: Seated   Other Seated Knee/Hip Exercises  seated press into physioball      Knee/Hip Exercises: Supine   Other Supine Knee/Hip Exercises  posterior pelvic tilt      Modalities   Modalities  Electrical Stimulation;Moist Heat      Moist Heat Therapy   Number Minutes Moist Heat  15 Minutes    Moist Heat Location  Lumbar Spine   with ESTIM     Electrical Stimulation   Electrical Stimulation Location  lumbar    Electrical Stimulation Action  IFC    Electrical Stimulation Parameters  to tolerance with heat    Electrical Stimulation Goals  Pain      Manual Therapy   Manual Therapy  Joint mobilization    Manual therapy comments  skilled palpation and monitoring during TPDN    Joint Mobilization  LLE LAD    Soft tissue mobilization  Rt paraspinals, gluts, QL, piriformis       Trigger Point Dry Needling - 05/04/19 0001    Consent Given?  Yes    Education Handout Provided  --   verbal education   Muscles Treated Back/Hip  Gluteus medius;Lumbar multifidi;Piriformis    Gluteus Medius Response  Twitch response elicited;Palpable increased muscle length   Rt   Piriformis Response  Twitch response elicited;Palpable increased muscle length   Rt   Lumbar multifidi Response  Twitch response elicited;Palpable increased muscle length   L3, 4          PT Education - 05/04/19 1245    Education Details  postural alignment, TPDN & expected outcomes  Person(s) Educated  Patient    Methods  Explanation    Comprehension  Verbalized understanding;Need further instruction       PT Short Term Goals - 04/21/19 1714      PT SHORT TERM GOAL #1   Title  pt will perform bed mobility wiht <=moderate pain    Baseline  severe at eval    Time  3    Period  Weeks    Status  New    Target Date  05/15/19      PT SHORT TERM GOAL #2   Title  pt will demo at least 25% available lumbar AROM    Baseline  approx 10% at eval    Time  3    Period  Weeks    Status  New    Target Date  05/15/19      PT SHORT TERM GOAL #3   Title  pt will be able to demo proper seated posture    Baseline  slouching at eval due to pain    Time  3    Period  Weeks    Status  New    Target Date  05/15/19        PT Long Term Goals - 04/21/19 1720      PT LONG TERM GOAL #1   Title  to be set at [facility]            Plan - 05/04/19 1147    Clinical Impression Statement   Twitch response to DN, pt reported feeling better when he got up. able to perform bed mobility with much less difficulty than last visit. Addressed sacral sitting and asked him to perform anterior pelvic tilts. Will benefit from further DN to lumbar paraspinals and hip. Significant tightness in bil HS continued today.    PT Treatment/Interventions  ADLs/Self Care Home Management;Cryotherapy;Electrical Stimulation;Ultrasound;Traction;Moist Heat;Iontophoresis 4mg /ml Dexamethasone;Gait training;Stair training;Functional mobility training;Therapeutic activities;Therapeutic exercise;Balance training;Patient/family education;Neuromuscular re-education;Manual techniques;Passive range of motion;Dry needling;Spinal Manipulations;Taping;Joint Manipulations    PT Next Visit Plan  continue DN, HS stretching, core engagement, upright posture    PT Home Exercise Plan  LTR, piriformis stretch, seated HSS, ant pelvic tilt, hooklying ball squeeze + ab set    Consulted and Agree with Plan of Care  Patient       Patient will benefit from skilled therapeutic intervention in order to improve the following deficits and impairments:  Difficulty walking, Increased muscle spasms, Decreased activity tolerance, Pain, Improper body mechanics, Impaired flexibility, Decreased strength, Decreased mobility, Postural dysfunction  Visit Diagnosis: Radiculopathy, lumbar region  Pain of right thigh     Problem List Patient Active Problem List   Diagnosis Date Noted  . Severe episode of recurrent major depressive disorder, without psychotic features ([place])   . Cannabis dependence ([place])   . Bipolar II disorder ([place]) 04/26/2019  . Cannabis use disorder, severe, dependence ([place]) 04/26/2019  . Substance induced mood disorder ([place]) 04/26/2019    Ruffin Lada C. Maniya Donovan PT, DPT 05/04/19 12:46 PM   [facility] [address] [place], [place], [address] Phone: [phone]   Fax:   [phone]  Name: [name] [name] MRN: [id number] Date of Birth: Oct 17, 1990

## 2019-05-11 ENCOUNTER — Other Ambulatory Visit: Payer: Self-pay

## 2019-05-11 ENCOUNTER — Ambulatory Visit: Payer: Medicaid Other | Admitting: Physical Therapy

## 2019-05-11 ENCOUNTER — Encounter: Payer: Self-pay | Admitting: Physical Therapy

## 2019-05-11 DIAGNOSIS — M5416 Radiculopathy, lumbar region: Secondary | ICD-10-CM | POA: Diagnosis not present

## 2019-05-11 DIAGNOSIS — M79651 Pain in right thigh: Secondary | ICD-10-CM

## 2019-05-11 NOTE — Therapy (Signed)
[place], [place], [address] Phone: [phone]   Fax:  [phone]  Physical Therapy Treatment  Patient Details  Name: [name] [name] MRN: [id number] Date of Birth: 10-07-1990 Referring Provider (PT): Gentry Fitz, MD   Encounter Date: 05/11/2019  PT End of Session - 05/11/19 1232    Visit Number  3    Date for PT Re-Evaluation  05/15/19    Authorization Type  MCD    Authorization Time Period  12/6-12/26    Authorization - Visit Number  2    Authorization - Number of Visits  3    PT Start Time  2202    PT Stop Time  1255    PT Time Calculation (min)  25 min       History reviewed. No pertinent past medical history.  History reviewed. No pertinent surgical history.  There were no vitals filed for this visit.  Subjective Assessment - 05/11/19 1233    Subjective  MD is saying I need surgery on my spine. I will talk to surgeon on December 21st.    Currently in Pain?  Yes    Pain Score  5     Pain Location  Back    Pain Orientation  Lower    Pain Descriptors / Indicators  Aching    Pain Radiating Towards  not much LE pain today                       [facility] Adult PT Treatment/Exercise - 05/11/19 0001      Knee/Hip Exercises: Stretches   Passive Hamstring Stretch  Right;Left;3 reps;20 seconds    Piriformis Stretch Limitations  active ankle to opposite knee x 3 each leg , difficulty lifting RLE     Other Knee/Hip Stretches  lower trunk rotation    Other Knee/Hip Stretches  passive Hip IR and ER bilateral x 3 each , passive knee to chest       Knee/Hip Exercises: Supine   Other Supine Knee/Hip Exercises  posterior pelvic tilt    Other Supine Knee/Hip Exercises  decompression position, feet in chair. Increased pain so discontinued       Manual Therapy   Joint Mobilization  --               PT Short Term Goals - 04/21/19 1714      PT SHORT TERM GOAL #1   Title   pt will perform bed mobility wiht <=moderate pain    Baseline  severe at eval    Time  3    Period  Weeks    Status  New    Target Date  05/15/19      PT SHORT TERM GOAL #2   Title  pt will demo at least 25% available lumbar AROM    Baseline  approx 10% at eval    Time  3    Period  Weeks    Status  New    Target Date  05/15/19      PT SHORT TERM GOAL #3   Title  pt will be able to demo proper seated posture    Baseline  slouching at eval due to pain    Time  3    Period  Weeks    Status  New    Target Date  05/15/19        PT Long Term Goals - 04/21/19 1720  PT LONG TERM GOAL #1   Title  to be set at [facility] PRN            Plan - 05/11/19 1304    Clinical Impression Statement  Pt reports no improvement with TPDN or modalities last session.  He reports pain more localized to lumbar spine today. Attempted decompression position which increased his pain. Reviewed HEP and performed passive hip ROM as tolerated. Encouraged him to keep a neutral spine as much as possible. He declined modalities. No increased pain at the end of session.    PT Next Visit Plan  continue DN, HS stretching, core engagement, upright posture    PT Home Exercise Plan  LTR, piriformis stretch, seated HSS, ant pelvic tilt, hooklying ball squeeze + ab set       Patient will benefit from skilled therapeutic intervention in order to improve the following deficits and impairments:  Difficulty walking, Increased muscle spasms, Decreased activity tolerance, Pain, Improper body mechanics, Impaired flexibility, Decreased strength, Decreased mobility, Postural dysfunction  Visit Diagnosis: Radiculopathy, lumbar region  Pain of right thigh     Problem List Patient Active Problem List   Diagnosis Date Noted  . Severe episode of recurrent major depressive disorder, without psychotic features (HCC)   . Cannabis dependence (HCC)   . Bipolar II disorder (HCC) 04/26/2019  . Cannabis use disorder,  severe, dependence (HCC) 04/26/2019  . Substance induced mood disorder (HCC) 04/26/2019    Sherrie Mustache, PTA 05/11/2019, 1:11 PM  [facility] [address] [place], [place], [address] Phone: [phone]   Fax:  [phone]  Name: [name] [name] MRN: [id number] Date of Birth: [birth year]

## 2019-05-13 ENCOUNTER — Ambulatory Visit: Payer: Medicaid Other | Admitting: Physical Therapy

## 2019-05-14 ENCOUNTER — Ambulatory Visit: Payer: Medicaid Other | Admitting: Physical Therapy

## 2019-05-14 ENCOUNTER — Other Ambulatory Visit: Payer: Self-pay

## 2019-05-14 ENCOUNTER — Encounter: Payer: Self-pay | Admitting: Physical Therapy

## 2019-05-14 DIAGNOSIS — M79651 Pain in right thigh: Secondary | ICD-10-CM

## 2019-05-14 DIAGNOSIS — M5416 Radiculopathy, lumbar region: Secondary | ICD-10-CM

## 2019-05-14 NOTE — Therapy (Signed)
[place], [place], [address] Phone: [phone]   Fax:  [phone]  Physical Therapy Treatment/Discharge  Patient Details  Name: [name] [name] MRN: [id number] Date of Birth: 1991-03-27 Referring Provider (PT): Gentry Fitz, MD   Encounter Date: 05/14/2019  PT End of Session - 05/14/19 1354    Visit Number  4    Date for PT Re-Evaluation  05/15/19    Authorization Type  MCD    Authorization Time Period  12/6-12/26    Authorization - Visit Number  3    Authorization - Number of Visits  3    PT Start Time  1330    PT Stop Time  9024    PT Time Calculation (min)  24 min    Activity Tolerance  Patient tolerated treatment well    Behavior During Therapy  [facility] for tasks assessed/performed       History reviewed. No pertinent past medical history.  History reviewed. No pertinent surgical history.  There were no vitals filed for this visit.  Subjective Assessment - 05/14/19 1331    Subjective  Pain more in back now.    Currently in Pain?  Yes    Pain Score  5     Pain Location  Back    Pain Orientation  Lower    Pain Descriptors / Indicators  Sore    Aggravating Factors   laying    Pain Relieving Factors  unsure         [facility] PT Assessment - 05/14/19 0001      Posture/Postural Control   Posture Comments  no trunk shift, Rt leg longer in supine but resolved with LLE LAD      AROM   Overall AROM Comments  able to flex to knees with discomfort side bend to mid thigh bil      Strength   Overall Strength Comments  unable to demo Rt quad set reporting no reflex in that leg since the pain                   [facility] Adult PT Treatment/Exercise - 05/14/19 0001      Knee/Hip Exercises: Stretches   Passive Hamstring Stretch Limitations  seated edge of bed- reaching heel long    Other Knee/Hip Stretches  lower trunk rotation    Other Knee/Hip Stretches  SKTC      Manual Therapy    Joint Mobilization  LLE LAD 2x30s    Soft tissue mobilization  Lt L1-5  lumbar paraspinals, QL             PT Education - 05/14/19 1358    Education Details  goals, POC    Person(s) Educated  Patient    Methods  Explanation    Comprehension  Verbalized understanding       PT Short Term Goals - 05/14/19 1333      PT SHORT TERM GOAL #1   Title  pt will perform bed mobility wiht <=moderate pain    Baseline  mild    Status  Achieved      PT SHORT TERM GOAL #2   Title  pt will demo at least 25% available lumbar AROM    Status  Achieved      PT SHORT TERM GOAL #3   Title  pt will be able to demo proper seated posture    Baseline  remains slouched but is significantly improved from eval    Status  Partially Met        PT Long Term Goals - 04/21/19 1720      PT LONG TERM GOAL #1   Title  to be set at [facility] PRN            Plan - 05/14/19 1358    Clinical Impression Statement  pt has met his short term goals at this time but is being d/c as MD told him he needs surgery. He will be attending that appointment on Monday and was encouraged to obtain a new referral should he feel that he needs to return to PT. Able to demo sit<>stand without holding breath and with reasonable speed and is able to demo bed mobility without facial signs of pain.    PT Treatment/Interventions  ADLs/Self Care Home Management;Cryotherapy;Electrical Stimulation;Ultrasound;Traction;Moist Heat;Iontophoresis 29m/ml Dexamethasone;Gait training;Stair training;Functional mobility training;Therapeutic activities;Therapeutic exercise;Balance training;Patient/family education;Neuromuscular re-education;Manual techniques;Passive range of motion;Dry needling;Spinal Manipulations;Taping;Joint Manipulations    PT Home Exercise Plan  LTR, piriformis stretch, seated HSS, ant pelvic tilt, hooklying ball squeeze + ab set    Consulted and Agree with Plan of Care  Patient       Patient will benefit from skilled  therapeutic intervention in order to improve the following deficits and impairments:  Difficulty walking, Increased muscle spasms, Decreased activity tolerance, Pain, Improper body mechanics, Impaired flexibility, Decreased strength, Decreased mobility, Postural dysfunction  Visit Diagnosis: Radiculopathy, lumbar region  Pain of right thigh     Problem List Patient Active Problem List   Diagnosis Date Noted  . Severe episode of recurrent major depressive disorder, without psychotic features ([place]   . Cannabis dependence ([place]   . Bipolar II disorder ([place] 04/26/2019  . Cannabis use disorder, severe, dependence ([place] 04/26/2019  . Substance induced mood disorder ([place] 04/26/2019    PHYSICAL THERAPY DISCHARGE SUMMARY  Visits from Start of Care: 4  Current functional level related to goals / functional outcomes: See above   Remaining deficits: See above   Education / Equipment: Anatomy of condition, POC, HEP, exercise form/rationale  Plan: Patient agrees to discharge.  Patient goals were met. Patient is being discharged due to                                                     ?????   Returning to MD to discuss possible surgical options.    [name] [name] C. Evadne Ose PT, DPT 05/14/19 2:01 PM   [facility] [address]Phone: [phone]  Fax:  [phone] Name: [name] NickersonMRN: [id number] of Birth: [birth year]

## 2019-05-19 ENCOUNTER — Ambulatory Visit: Payer: Medicaid Other | Admitting: Physical Therapy

## 2019-05-21 ENCOUNTER — Ambulatory Visit: Payer: Medicaid Other | Admitting: Physical Therapy

## 2019-05-27 ENCOUNTER — Other Ambulatory Visit: Payer: Self-pay | Admitting: Orthopedic Surgery

## 2019-06-01 ENCOUNTER — Ambulatory Visit: Payer: Medicaid Other | Admitting: Physical Therapy

## 2019-06-04 NOTE — Progress Notes (Signed)
[facility] [address] [place], [place] - [address] [place] [address] Phone: [phone] Fax: [phone]      Your procedure is scheduled on Tuesday, June 16, 2019.  Report to [facility] Main Entrance "A" at 10:40 A.M., and check in at the Admitting office.  Call this number if you have problems the morning of surgery:  [phone]    Remember:  Do not eat after midnight the night before your surgery  You may drink clear liquids until 10:40 AM the morning of your surgery.   Clear liquids allowed are: Water, Non-Citrus Juices (without pulp), Carbonated Beverages, Clear Tea, Black Coffee Only, and Gatorade  Please complete your PRE-SURGERY ENSURE that was provided to you by 10:40 AM the morning of surgery.  Please, if able, drink it in one setting. DO NOT SIP.     Take these medicines the morning of surgery with A SIP OF WATER: buPROPion (WELLBUTRIN XL)  lithium carbonate (LITHOBID)   7 days prior to surgery STOP taking any Aspirin (unless otherwise instructed by your surgeon), Aleve, Naproxen, Ibuprofen, Motrin, Advil, Goody's, BC's, all herbal medications, fish oil, and all vitamins.    The Morning of Surgery  Do not wear jewelry.  Do not wear lotions, powders, colognes, or deodorant  Men may shave face and neck.  Do not bring valuables to the hospital.  [facility] is not responsible for any belongings or valuables.  If you are a smoker, DO NOT Smoke 24 hours prior to surgery  If you wear a CPAP at night please bring your mask, tubing, and machine the morning of surgery   Remember that you must have someone to transport you home after your surgery, and remain with you for 24 hours if you are discharged the same day.   Please bring cases for contacts, glasses, hearing aids, dentures or bridgework because it cannot be worn into surgery.    Leave your suitcase in the car.  After surgery it may be brought to your  room.  For patients admitted to the hospital, discharge time will be determined by your treatment team.  Patients discharged the day of surgery will not be allowed to drive home.    Special instructions:   [place]- Preparing For Surgery  Before surgery, you can play an important role. Because skin is not sterile, your skin needs to be as free of germs as possible. You can reduce the number of germs on your skin by washing with CHG (chlorahexidine gluconate) Soap before surgery.  CHG is an antiseptic cleaner which kills germs and bonds with the skin to continue killing germs even after washing.    Oral Hygiene is also important to reduce your risk of infection.  Remember - BRUSH YOUR TEETH THE MORNING OF SURGERY WITH YOUR REGULAR TOOTHPASTE  Please do not use if you have an allergy to CHG or antibacterial soaps. If your skin becomes reddened/irritated stop using the CHG.  Do not shave (including legs and underarms) for at least 48 hours prior to first CHG shower. It is OK to shave your face.  Please follow these instructions carefully.   1. Shower the NIGHT BEFORE SURGERY and the MORNING OF SURGERY with CHG Soap.   2. If you chose to wash your hair, wash your hair first as usual with your normal shampoo.  3. After you shampoo, rinse your hair and body thoroughly to remove the shampoo.  4. Use CHG as you  would any other liquid soap. You can apply CHG directly to the skin and wash gently with a scrungie or a clean washcloth.   5. Apply the CHG Soap to your body ONLY FROM THE NECK DOWN.  Do not use on open wounds or open sores. Avoid contact with your eyes, ears, mouth and genitals (private parts). Wash Face and genitals (private parts)  with your normal soap.   6. Wash thoroughly, paying special attention to the area where your surgery will be performed.  7. Thoroughly rinse your body with warm water from the neck down.  8. DO NOT shower/wash with your normal soap after using and  rinsing off the CHG Soap.  9. Pat yourself dry with a CLEAN TOWEL.  10. Wear CLEAN PAJAMAS to bed the night before surgery, wear comfortable clothes the morning of surgery  11. Place CLEAN SHEETS on your bed the night of your first shower and DO NOT SLEEP WITH PETS.    Day of Surgery:  Please shower the morning of surgery with the CHG soap Do not apply any deodorants/lotions. Please wear clean clothes to the hospital/surgery center.   Remember to brush your teeth WITH YOUR REGULAR TOOTHPASTE.   Please read over the following fact sheets that you were given.

## 2019-06-05 ENCOUNTER — Other Ambulatory Visit (HOSPITAL_COMMUNITY): Payer: Medicaid Other

## 2019-06-05 ENCOUNTER — Inpatient Hospital Stay (HOSPITAL_COMMUNITY)
Admission: RE | Admit: 2019-06-05 | Discharge: 2019-06-05 | Disposition: A | Discharge status: Home / Self Care | Payer: Medicaid Other | Source: Ambulatory Visit

## 2019-06-05 NOTE — Progress Notes (Signed)
[place], [place] [address] Phone: [phone] Fax: [phone]      Your procedure is scheduled on Tuesday, June 16, 2019.  Report to [facility] Main Entrance "A" at 10:40 A.M., and check in at the Admitting office.  Call this number if you have problems the morning of surgery:  [phone]    Remember:  Do not eat after midnight the night before your surgery  You may drink clear liquids until 10:40 AM the morning of your surgery.   Clear liquids allowed are: Water, Non-Citrus Juices (without pulp), Carbonated Beverages, Clear Tea, Black Coffee Only, and Gatorade   Enhanced Recovery after Surgery for Orthopedics Enhanced Recovery after Surgery is a protocol used to improve the stress on your body and your recovery after surgery.  Patient Instructions  . The night before surgery:  o No food after midnight. ONLY clear liquids after midnight  .  Marland Kitchen The day of surgery (if you do NOT have diabetes):  o Drink ONE (1) Pre-Surgery Clear Ensure as directed.   o This drink was given to you during your hospital  pre-op appointment visit. o The pre-op nurse will instruct you on the time to drink the  Pre-Surgery Ensure depending on your surgery time. o Finish the drink at the designated time by the pre-op nurse.  o Nothing else to drink after completing the  Pre-Surgery Clear Ensure.     Take these medicines the morning of surgery with A SIP OF WATER: buPROPion (WELLBUTRIN XL)  lithium carbonate (LITHOBID)   7 days prior to surgery STOP taking any Aspirin (unless otherwise instructed by your surgeon), Aleve, Naproxen, Ibuprofen, Motrin, Advil, Goody's, BC's, all herbal medications, fish oil, and all vitamins.    The Morning of Surgery  Do not wear jewelry.  Do not wear lotions, powders, colognes, or deodorant  Men may shave face and neck.  Do not bring valuables to the  hospital.  [facility] is not responsible for any belongings or valuables.  If you are a smoker, DO NOT Smoke 24 hours prior to surgery  If you wear a CPAP at night please bring your mask, tubing, and machine the morning of surgery   Remember that you must have someone to transport you home after your surgery, and remain with you for 24 hours if you are discharged the same day.   Please bring cases for contacts, glasses, hearing aids, dentures or bridgework because it cannot be worn into surgery.    Leave your suitcase in the car.  After surgery it may be brought to your room.  For patients admitted to the hospital, discharge time will be determined by your treatment team.  Patients discharged the day of surgery will not be allowed to drive home.    Special instructions:   [place]- Preparing For Surgery  Before surgery, you can play an important role. Because skin is not sterile, your skin needs to be as free of germs as possible. You can reduce the number of germs on your skin by washing with CHG (chlorahexidine gluconate) Soap before surgery.  CHG is an antiseptic cleaner which kills germs and bonds with the skin to continue killing germs even after washing.    Oral Hygiene is also important to reduce your risk of infection.  Remember - BRUSH YOUR TEETH THE MORNING OF SURGERY WITH YOUR REGULAR TOOTHPASTE  Please do not use if you have  an allergy to CHG or antibacterial soaps. If your skin becomes reddened/irritated stop using the CHG.  Do not shave (including legs and underarms) for at least 48 hours prior to first CHG shower. It is OK to shave your face.  Please follow these instructions carefully.   1. Shower the NIGHT BEFORE SURGERY and the MORNING OF SURGERY with CHG Soap.   2. If you chose to wash your hair, wash your hair first as usual with your normal shampoo.  3. After you shampoo, rinse your hair and body thoroughly to remove the shampoo.  4. Use CHG as you would  any other liquid soap. You can apply CHG directly to the skin and wash gently with a scrungie or a clean washcloth.   5. Apply the CHG Soap to your body ONLY FROM THE NECK DOWN.  Do not use on open wounds or open sores. Avoid contact with your eyes, ears, mouth and genitals (private parts). Wash Face and genitals (private parts)  with your normal soap.   6. Wash thoroughly, paying special attention to the area where your surgery will be performed.  7. Thoroughly rinse your body with warm water from the neck down.  8. DO NOT shower/wash with your normal soap after using and rinsing off the CHG Soap.  9. Pat yourself dry with a CLEAN TOWEL.  10. Wear CLEAN PAJAMAS to bed the night before surgery, wear comfortable clothes the morning of surgery  11. Place CLEAN SHEETS on your bed the night of your first shower and DO NOT SLEEP WITH PETS.    Day of Surgery:  Please shower the morning of surgery with the CHG soap Do not apply any deodorants/lotions. Please wear clean clothes to the hospital/surgery center.   Remember to brush your teeth WITH YOUR REGULAR TOOTHPASTE.   Please read over the following fact sheets that you were given.

## 2019-06-11 NOTE — Pre-Procedure Instructions (Signed)
[name] [name]  06/11/2019    Your procedure is scheduled on Tuesday, January 19..  Report to [facility], Main Entrance or Entrance "A" at 10:40 AM              Your surgery or procedure is scheduled for 1:40 PM   Call this number if you have problems the morning of surgery: [phone]  This is the number for the Pre- Surgical Desk.                  For any other questions, please call [phone], Monday - Friday 8 AM - 4 PM.   Remember:  Do not eat after midnight, Monday January 18.  You may drink clear liquids until 10:40 AM .  Clear liquids allowed are:  Water, Juice (non-citric and without pulp), Carbonated beverages, Clear Tea, Black Coffee only, Plain Jell-O only, Gatorade and Plain Popsicles only    Take these medicines the morning of surgery with A SIP OF WATER:  buPROPion (WELLBUTRIN XL) .     [place]- Preparing For Surgery  Before surgery, you can play an important role. Because skin is not sterile, your skin needs to be as free of germs as possible. You can reduce the number of germs on your skin by washing with CHG (chlorahexidine gluconate) Soap before surgery.  CHG is an antiseptic cleaner which kills germs and bonds with the skin to continue killing germs even after washing.    Oral Hygiene is also important to reduce your risk of infection.  Remember - BRUSH YOUR TEETH THE MORNING OF SURGERY WITH YOUR REGULAR TOOTHPASTE  Please do not use if you have an allergy to CHG or antibacterial soaps. If your skin becomes reddened/irritated stop using the CHG.  Do not shave (including legs and underarms) for at least 48 hours prior to first CHG shower. It is OK to shave your face.  Please follow these instructions carefully.   1. Shower the NIGHT BEFORE SURGERY and the MORNING OF SURGERY with CHG.   2. If you chose to wash your hair, wash your hair first as usual with your normal shampoo.  3. After you shampoo, wash your face and private area with the  soap you use at home, then rinse your hair and body thoroughly to remove the shampoo and soap.rinse your hair and body thoroughly to remove the shampoo.  4. Use CHG as you would any other liquid soap. You can apply CHG directly to the skin and wash gently with a scrungie or a clean washcloth.   5. Apply the CHG Soap to your body ONLY FROM THE NECK DOWN.  Do not use on open wounds or open sores. Avoid contact with your eyes, ears, mouth and genitals (private parts).   6. Wash thoroughly, paying special attention to the area where your surgery will be performed.  7. Thoroughly rinse your body with warm water from the neck down.  8. DO NOT shower/wash with your normal soap after using and rinsing off the CHG Soap.  9. Pat yourself dry with a CLEAN TOWEL.  10. Wear CLEAN PAJAMAS to bed the night before surgery, wear comfortable clothes the morning of surgery  11. Place CLEAN SHEETS on your bed the night of your first shower and DO NOT SLEEP WITH PETS.  Day of Surgery: Shower as instructed above. Do not wear lotions, powders, or colognes, or deodorant. Please wear clean clothes to the hospital/surgery center.  Do not wear  lotions, powders, or perfumes, or deodorant. Remember to brush your teeth WITH YOUR REGULAR TOOTHPAST  Do not wear jewelry, make-up or nail polish.              Men may shave face and neck.  Do not bring valuables to the hospital.  [facility] is not responsible for any belongings or valuables.  Contacts, dentures or bridgework may not be worn into surgery.  Leave your suitcase in the car.  After surgery it may be brought to your room.  For patients admitted to the hospital, discharge time will be determined by your treatment team.  Patients discharged the day of surgery will not be allowed to drive home.   Special instructions: NO Smoking 24 hours prior to surgery.  Please read over the following fact sheets that you were given: Pain Booklet,  Incentive Spirometry,  Surgical Site Infections.

## 2019-06-12 ENCOUNTER — Encounter (HOSPITAL_COMMUNITY): Payer: Self-pay

## 2019-06-12 ENCOUNTER — Encounter (HOSPITAL_COMMUNITY)
Admission: RE | Admit: 2019-06-12 | Discharge: 2019-06-12 | Disposition: A | Discharge status: Home / Self Care | Payer: Medicaid Other | Source: Ambulatory Visit | Attending: Orthopedic Surgery | Admitting: Orthopedic Surgery

## 2019-06-12 ENCOUNTER — Other Ambulatory Visit: Payer: Self-pay

## 2019-06-12 ENCOUNTER — Other Ambulatory Visit (HOSPITAL_COMMUNITY): Payer: Medicaid Other

## 2019-06-12 DIAGNOSIS — Z01812 Encounter for preprocedural laboratory examination: Secondary | ICD-10-CM | POA: Insufficient documentation

## 2019-06-12 DIAGNOSIS — M5416 Radiculopathy, lumbar region: Secondary | ICD-10-CM | POA: Insufficient documentation

## 2019-06-12 DIAGNOSIS — M5126 Other intervertebral disc displacement, lumbar region: Secondary | ICD-10-CM | POA: Diagnosis not present

## 2019-06-12 HISTORY — DX: Anxiety disorder, unspecified: F41.9

## 2019-06-12 HISTORY — DX: Bipolar disorder, unspecified: F31.9

## 2019-06-12 LAB — COMPREHENSIVE METABOLIC PANEL
ALT: 16 U/L (ref 0–44)
AST: 15 U/L (ref 15–41)
Albumin: 4 g/dL (ref 3.5–5.0)
Alkaline Phosphatase: 41 U/L (ref 38–126)
Anion gap: 9 (ref 5–15)
BUN: 13 mg/dL (ref 6–20)
CO2: 25 mmol/L (ref 22–32)
Calcium: 9.6 mg/dL (ref 8.9–10.3)
Chloride: 105 mmol/L (ref 98–111)
Creatinine, Ser: 0.83 mg/dL (ref 0.61–1.24)
GFR calc Af Amer: >60 mL/min (ref >60)
GFR calc non Af Amer: >60 mL/min (ref >60)
Glucose, Bld: 108 mg/dL — ABNORMAL HIGH (ref 70–99)
Potassium: 4.6 mmol/L (ref 3.5–5.1)
Sodium: 139 mmol/L (ref 135–145)
Total Bilirubin: 0.6 mg/dL (ref 0.3–1.2)
Total Protein: 7.2 g/dL (ref 6.5–8.1)

## 2019-06-12 LAB — CBC WITH DIFFERENTIAL/PLATELET
Abs Immature Granulocytes: 0.03 10*3/uL (ref 0.00–0.07)
Basophils Absolute: 0.1 10*3/uL (ref 0.0–0.1)
Basophils Relative: 1 %
Eosinophils Absolute: 0.3 10*3/uL (ref 0.0–0.5)
Eosinophils Relative: 4 %
HCT: 51.2 % (ref 39.0–52.0)
Hemoglobin: 16.6 g/dL (ref 13.0–17.0)
Immature Granulocytes: 0 %
Lymphocytes Relative: 26 %
Lymphs Abs: 2.1 10*3/uL (ref 0.7–4.0)
MCH: 29.8 pg (ref 26.0–34.0)
MCHC: 32.4 g/dL (ref 30.0–36.0)
MCV: 91.9 fL (ref 80.0–100.0)
Monocytes Absolute: 0.8 10*3/uL (ref 0.1–1.0)
Monocytes Relative: 9 %
Neutro Abs: 4.7 10*3/uL (ref 1.7–7.7)
Neutrophils Relative %: 60 %
Platelets: 288 10*3/uL (ref 150–400)
RBC: 5.57 MIL/uL (ref 4.22–5.81)
RDW: 12.7 % (ref 11.5–15.5)
WBC: 8 10*3/uL (ref 4.0–10.5)
nRBC: 0 % (ref 0.0–0.2)

## 2019-06-12 LAB — URINALYSIS, ROUTINE W REFLEX MICROSCOPIC
Bilirubin Urine: NEGATIVE
Glucose, UA: NEGATIVE mg/dL
Hgb urine dipstick: NEGATIVE
Ketones, ur: NEGATIVE mg/dL
Leukocytes,Ua: NEGATIVE
Nitrite: NEGATIVE
Protein, ur: NEGATIVE mg/dL
Specific Gravity, Urine: 1.018 (ref 1.005–1.030)
pH: 7 (ref 5.0–8.0)

## 2019-06-12 LAB — SURGICAL PCR SCREEN
MRSA, PCR: NEGATIVE
Staphylococcus aureus: NEGATIVE

## 2019-06-12 LAB — PROTIME-INR
INR: 1 (ref 0.8–1.2)
Prothrombin Time: 13.3 seconds (ref 11.4–15.2)

## 2019-06-12 LAB — APTT: aPTT: 30 seconds (ref 24–36)

## 2019-06-12 NOTE — Progress Notes (Addendum)
PCP - none  Cardiologist - none  Chest x-ray - na  EKG - na  Stress Test - no  ECHO - no  Cardiac Cath - no  Sleep Study - no  CPAP - no  LABS-CBC with Diff, CMP, PT/INR, PTT, UA, PCR  ASA- no  Motrin on hold for surgery  ERAS-yes, clear liquids until 1040.  HA1C-na Fasting Blood Sugar - na Checks Blood Sugar __0___ times a day  Anesthesia-  Pt denies having chest pain, sob, or fever at this time. All instructions explained to the pt, with a verbal understanding of the material. Pt agrees to go over the instructions while at home for a better understanding. Pt also instructed to self quarantine after being tested for COVID-19. I answered that patient has been diagnnosied with COVID,  The opportunity to ask questions was provided.  Mr Thoma lives in a Rehabil[facility] center,  [facility] 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 Mr Nygard has been at [facility] since 03/21/2019. Mr. Space will have to have Covid test in route to hospital the am of surgery. I instructed patient to not smoke within 24 hours prior to surgery and the importance of not smoking. Mr Wehner stated that he probably will not be able to not smoke and he spoke with Dr. Yevette Edwards.

## 2019-06-15 MED ORDER — DEXTROSE 5 % IV SOLN
3.0000 g | INTRAVENOUS | Status: AC
  Administered 2019-06-16: 3 g via INTRAVENOUS
  Filled 2019-06-15: qty 3
  Filled 2019-06-15: qty 3000

## 2019-06-16 ENCOUNTER — Other Ambulatory Visit: Payer: Self-pay

## 2019-06-16 ENCOUNTER — Ambulatory Visit (HOSPITAL_COMMUNITY)
Admission: RE | Admit: 2019-06-16 | Discharge: 2019-06-17 | Disposition: A | Discharge status: Home / Self Care | Payer: Medicaid Other | Attending: Orthopedic Surgery | Admitting: Orthopedic Surgery

## 2019-06-16 ENCOUNTER — Ambulatory Visit (HOSPITAL_COMMUNITY): Payer: Medicaid Other | Admitting: Certified Registered Nurse Anesthetist

## 2019-06-16 ENCOUNTER — Other Ambulatory Visit (HOSPITAL_COMMUNITY)
Admission: RE | Admit: 2019-06-16 | Discharge: 2019-06-16 | Disposition: A | Discharge status: Home / Self Care | Payer: Medicaid Other | Source: Ambulatory Visit | Attending: Orthopedic Surgery | Admitting: Orthopedic Surgery

## 2019-06-16 ENCOUNTER — Encounter (HOSPITAL_COMMUNITY): Payer: Self-pay | Admitting: Orthopedic Surgery

## 2019-06-16 ENCOUNTER — Ambulatory Visit (HOSPITAL_COMMUNITY): Payer: Medicaid Other

## 2019-06-16 ENCOUNTER — Encounter (HOSPITAL_COMMUNITY)
Admission: RE | Disposition: A | Discharge status: Home / Self Care | Payer: Self-pay | Source: Home / Self Care | Attending: Orthopedic Surgery

## 2019-06-16 DIAGNOSIS — F17210 Nicotine dependence, cigarettes, uncomplicated: Secondary | ICD-10-CM | POA: Diagnosis not present

## 2019-06-16 DIAGNOSIS — F3181 Bipolar II disorder: Secondary | ICD-10-CM | POA: Insufficient documentation

## 2019-06-16 DIAGNOSIS — G9741 Accidental puncture or laceration of dura during a procedure: Secondary | ICD-10-CM | POA: Diagnosis not present

## 2019-06-16 DIAGNOSIS — Y838 Other surgical procedures as the cause of abnormal reaction of the patient, or of later complication, without mention of misadventure at the time of the procedure: Secondary | ICD-10-CM | POA: Diagnosis not present

## 2019-06-16 DIAGNOSIS — M5116 Intervertebral disc disorders with radiculopathy, lumbar region: Secondary | ICD-10-CM | POA: Diagnosis present

## 2019-06-16 DIAGNOSIS — Z79899 Other long term (current) drug therapy: Secondary | ICD-10-CM | POA: Diagnosis not present

## 2019-06-16 DIAGNOSIS — F419 Anxiety disorder, unspecified: Secondary | ICD-10-CM | POA: Insufficient documentation

## 2019-06-16 DIAGNOSIS — Z20822 Contact with and (suspected) exposure to covid-19: Secondary | ICD-10-CM | POA: Insufficient documentation

## 2019-06-16 DIAGNOSIS — Z419 Encounter for procedure for purposes other than remedying health state, unspecified: Secondary | ICD-10-CM

## 2019-06-16 DIAGNOSIS — M5410 Radiculopathy, site unspecified: Secondary | ICD-10-CM | POA: Diagnosis present

## 2019-06-16 HISTORY — PX: LUMBAR LAMINECTOMY/DECOMPRESSION MICRODISCECTOMY: SHX5026

## 2019-06-16 LAB — RESPIRATORY PANEL BY RT PCR (FLU A&B, COVID)
Influenza A by PCR: NEGATIVE
Influenza B by PCR: NEGATIVE
SARS Coronavirus 2 by RT PCR: NEGATIVE

## 2019-06-16 SURGERY — LUMBAR LAMINECTOMY/DECOMPRESSION MICRODISCECTOMY
Anesthesia: General | Site: Spine Lumbar | Laterality: Right

## 2019-06-16 MED ORDER — HEMOSTATIC AGENTS (NO CHARGE) OPTIME
TOPICAL | Status: DC | PRN
  Administered 2019-06-16: 1 via TOPICAL

## 2019-06-16 MED ORDER — METHOCARBAMOL 500 MG PO TABS
500.0000 mg | ORAL_TABLET | Freq: Four times a day (QID) | ORAL | Status: DC | PRN
  Administered 2019-06-16 – 2019-06-17 (×2): 500 mg via ORAL
  Filled 2019-06-16 (×2): qty 1

## 2019-06-16 MED ORDER — MIDAZOLAM HCL 5 MG/5ML IJ SOLN
INTRAMUSCULAR | Status: DC | PRN
  Administered 2019-06-16: 2 mg via INTRAVENOUS

## 2019-06-16 MED ORDER — LIDOCAINE 2% (20 MG/ML) 5 ML SYRINGE
INTRAMUSCULAR | Status: DC | PRN
  Administered 2019-06-16: 60 mg via INTRAVENOUS
  Administered 2019-06-16: 40 mg via INTRAVENOUS

## 2019-06-16 MED ORDER — PROMETHAZINE HCL 25 MG/ML IJ SOLN: 6.2500 mg | INTRAMUSCULAR | Status: DC | PRN

## 2019-06-16 MED ORDER — POVIDONE-IODINE 7.5 % EX SOLN: Freq: Once | CUTANEOUS | Status: DC

## 2019-06-16 MED ORDER — FENTANYL CITRATE (PF) 250 MCG/5ML IJ SOLN
INTRAMUSCULAR | Status: AC
  Filled 2019-06-16: qty 5

## 2019-06-16 MED ORDER — FENTANYL CITRATE (PF) 250 MCG/5ML IJ SOLN
INTRAMUSCULAR | Status: DC | PRN
  Administered 2019-06-16 (×4): 50 ug via INTRAVENOUS
  Administered 2019-06-16 (×4): 25 ug via INTRAVENOUS
  Administered 2019-06-16: 50 ug via INTRAVENOUS
  Administered 2019-06-16: 100 ug via INTRAVENOUS

## 2019-06-16 MED ORDER — SUGAMMADEX SODIUM 200 MG/2ML IV SOLN
INTRAVENOUS | Status: DC | PRN
  Administered 2019-06-16: 200 mg via INTRAVENOUS

## 2019-06-16 MED ORDER — PANTOPRAZOLE SODIUM 40 MG PO TBEC
40.0000 mg | DELAYED_RELEASE_TABLET | Freq: Every day | ORAL | Status: DC
  Administered 2019-06-16: 40 mg via ORAL
  Filled 2019-06-16: qty 1

## 2019-06-16 MED ORDER — SODIUM CHLORIDE 0.9% FLUSH: 3.0000 mL | Freq: Two times a day (BID) | INTRAVENOUS | Status: DC

## 2019-06-16 MED ORDER — METHYLENE BLUE 0.5 % INJ SOLN
INTRAVENOUS | Status: DC | PRN
  Administered 2019-06-16: 1 mL

## 2019-06-16 MED ORDER — BUPIVACAINE HCL (PF) 0.25 % IJ SOLN
INTRAMUSCULAR | Status: AC
  Filled 2019-06-16: qty 30

## 2019-06-16 MED ORDER — THROMBIN 20000 UNITS EX SOLR: CUTANEOUS | Status: DC | PRN

## 2019-06-16 MED ORDER — ACETAMINOPHEN 325 MG PO TABS: 650.0000 mg | ORAL_TABLET | ORAL | Status: DC | PRN

## 2019-06-16 MED ORDER — ACETAMINOPHEN 650 MG RE SUPP: 650.0000 mg | RECTAL | Status: DC | PRN

## 2019-06-16 MED ORDER — CELECOXIB 200 MG PO CAPS
ORAL_CAPSULE | ORAL | Status: AC
  Administered 2019-06-16: 400 mg via ORAL
  Filled 2019-06-16: qty 2

## 2019-06-16 MED ORDER — CELECOXIB 200 MG PO CAPS: 400.0000 mg | ORAL_CAPSULE | Freq: Once | ORAL | Status: AC

## 2019-06-16 MED ORDER — BUPIVACAINE LIPOSOME 1.3 % IJ SUSP
20.0000 mL | Freq: Once | INTRAMUSCULAR | Status: DC
  Filled 2019-06-16: qty 20

## 2019-06-16 MED ORDER — ONDANSETRON HCL 4 MG/2ML IJ SOLN
INTRAMUSCULAR | Status: DC | PRN
  Administered 2019-06-16: 4 mg via INTRAVENOUS

## 2019-06-16 MED ORDER — BISACODYL 5 MG PO TBEC: 5.0000 mg | DELAYED_RELEASE_TABLET | Freq: Every day | ORAL | Status: DC | PRN

## 2019-06-16 MED ORDER — 0.9 % SODIUM CHLORIDE (POUR BTL) OPTIME
TOPICAL | Status: DC | PRN
  Administered 2019-06-16: 1000 mL

## 2019-06-16 MED ORDER — ALUM & MAG HYDROXIDE-SIMETH 200-200-20 MG/5ML PO SUSP: 30.0000 mL | Freq: Four times a day (QID) | ORAL | Status: DC | PRN

## 2019-06-16 MED ORDER — PROPOFOL 10 MG/ML IV BOLUS
INTRAVENOUS | Status: DC | PRN
  Administered 2019-06-16: 150 mg via INTRAVENOUS

## 2019-06-16 MED ORDER — PHENYLEPHRINE 40 MCG/ML (10ML) SYRINGE FOR IV PUSH (FOR BLOOD PRESSURE SUPPORT)
PREFILLED_SYRINGE | INTRAVENOUS | Status: DC | PRN
  Administered 2019-06-16 (×2): 80 ug via INTRAVENOUS
  Administered 2019-06-16: 40 ug via INTRAVENOUS

## 2019-06-16 MED ORDER — SODIUM CHLORIDE 0.9% FLUSH: 3.0000 mL | INTRAVENOUS | Status: DC | PRN

## 2019-06-16 MED ORDER — THROMBIN 20000 UNITS EX SOLR
CUTANEOUS | Status: AC
  Filled 2019-06-16: qty 20000

## 2019-06-16 MED ORDER — FLEET ENEMA 7-19 GM/118ML RE ENEM: 1.0000 | ENEMA | Freq: Once | RECTAL | Status: DC | PRN

## 2019-06-16 MED ORDER — ARIPIPRAZOLE 10 MG PO TABS
10.0000 mg | ORAL_TABLET | Freq: Every day | ORAL | Status: DC
  Administered 2019-06-16: 10 mg via ORAL
  Filled 2019-06-16: qty 1

## 2019-06-16 MED ORDER — HEMOSTATIC AGENTS (NO CHARGE) OPTIME
TOPICAL | Status: DC | PRN
  Administered 2019-06-16 (×2): 1 via TOPICAL

## 2019-06-16 MED ORDER — DEXAMETHASONE SODIUM PHOSPHATE 10 MG/ML IJ SOLN
INTRAMUSCULAR | Status: DC | PRN
  Administered 2019-06-16: 4 mg via INTRAVENOUS

## 2019-06-16 MED ORDER — LITHIUM CARBONATE ER 300 MG PO TBCR
600.0000 mg | EXTENDED_RELEASE_TABLET | Freq: Two times a day (BID) | ORAL | Status: DC
  Administered 2019-06-16 – 2019-06-17 (×2): 600 mg via ORAL
  Filled 2019-06-16 (×2): qty 2

## 2019-06-16 MED ORDER — BUPIVACAINE LIPOSOME 1.3 % IJ SUSP
INTRAMUSCULAR | Status: DC | PRN
  Administered 2019-06-16: 20 mL

## 2019-06-16 MED ORDER — MENTHOL 3 MG MT LOZG: 1.0000 | LOZENGE | OROMUCOSAL | Status: DC | PRN

## 2019-06-16 MED ORDER — ROCURONIUM BROMIDE 10 MG/ML (PF) SYRINGE
PREFILLED_SYRINGE | INTRAVENOUS | Status: DC | PRN
  Administered 2019-06-16: 60 mg via INTRAVENOUS

## 2019-06-16 MED ORDER — METHOCARBAMOL 500 MG PO TABS: 500.0000 mg | ORAL_TABLET | Freq: Four times a day (QID) | ORAL | 0 refills | Status: DC | PRN

## 2019-06-16 MED ORDER — SODIUM CHLORIDE 0.9 % IV SOLN: 250.0000 mL | INTRAVENOUS | Status: DC

## 2019-06-16 MED ORDER — ACETAMINOPHEN 500 MG PO TABS
ORAL_TABLET | ORAL | Status: AC
  Administered 2019-06-16: 1000 mg via ORAL
  Filled 2019-06-16: qty 2

## 2019-06-16 MED ORDER — KETOROLAC TROMETHAMINE 15 MG/ML IJ SOLN: 7.5000 mg | Freq: Four times a day (QID) | INTRAMUSCULAR | Status: DC | PRN

## 2019-06-16 MED ORDER — ACETAMINOPHEN 500 MG PO TABS
1000.0000 mg | ORAL_TABLET | Freq: Four times a day (QID) | ORAL | Status: DC
  Administered 2019-06-16 – 2019-06-17 (×3): 1000 mg via ORAL
  Filled 2019-06-16 (×3): qty 2

## 2019-06-16 MED ORDER — ZOLPIDEM TARTRATE 5 MG PO TABS: 5.0000 mg | ORAL_TABLET | Freq: Every evening | ORAL | Status: DC | PRN

## 2019-06-16 MED ORDER — BUPROPION HCL ER (XL) 150 MG PO TB24
150.0000 mg | ORAL_TABLET | Freq: Every day | ORAL | Status: DC
  Administered 2019-06-16 – 2019-06-17 (×2): 150 mg via ORAL
  Filled 2019-06-16 (×2): qty 1

## 2019-06-16 MED ORDER — BUPIVACAINE HCL (PF) 0.25 % IJ SOLN
INTRAMUSCULAR | Status: DC | PRN
  Administered 2019-06-16: 10 mL
  Administered 2019-06-16: 20 mL

## 2019-06-16 MED ORDER — PHENOL 1.4 % MT LIQD: 1.0000 | OROMUCOSAL | Status: DC | PRN

## 2019-06-16 MED ORDER — PANTOPRAZOLE SODIUM 40 MG IV SOLR: 40.0000 mg | Freq: Every day | INTRAVENOUS | Status: DC

## 2019-06-16 MED ORDER — LACTATED RINGERS IV SOLN: INTRAVENOUS | Status: DC

## 2019-06-16 MED ORDER — SENNOSIDES-DOCUSATE SODIUM 8.6-50 MG PO TABS: 1.0000 | ORAL_TABLET | Freq: Every evening | ORAL | Status: DC | PRN

## 2019-06-16 MED ORDER — METHYLENE BLUE 0.5 % INJ SOLN
INTRAVENOUS | Status: AC
  Filled 2019-06-16: qty 10

## 2019-06-16 MED ORDER — MINERAL OIL LIGHT OIL
TOPICAL_OIL | Freq: Once | Status: AC
  Administered 2019-06-16: 1 via TOPICAL
  Filled 2019-06-16: qty 10

## 2019-06-16 MED ORDER — TRAZODONE HCL 50 MG PO TABS
50.0000 mg | ORAL_TABLET | Freq: Every evening | ORAL | Status: DC | PRN
  Administered 2019-06-16: 50 mg via ORAL
  Filled 2019-06-16 (×2): qty 1

## 2019-06-16 MED ORDER — CEFAZOLIN SODIUM-DEXTROSE 2-4 GM/100ML-% IV SOLN
2.0000 g | Freq: Three times a day (TID) | INTRAVENOUS | Status: AC
  Administered 2019-06-16 – 2019-06-17 (×2): 2 g via INTRAVENOUS
  Filled 2019-06-16 (×2): qty 100

## 2019-06-16 MED ORDER — PROPOFOL 10 MG/ML IV BOLUS
INTRAVENOUS | Status: AC
  Filled 2019-06-16: qty 40

## 2019-06-16 MED ORDER — FENTANYL CITRATE (PF) 100 MCG/2ML IJ SOLN: 25.0000 ug | INTRAMUSCULAR | Status: DC | PRN

## 2019-06-16 MED ORDER — ONDANSETRON HCL 4 MG/2ML IJ SOLN: 4.0000 mg | Freq: Four times a day (QID) | INTRAMUSCULAR | Status: DC | PRN

## 2019-06-16 MED ORDER — ACETAMINOPHEN 500 MG PO TABS: 1000.0000 mg | ORAL_TABLET | Freq: Once | ORAL | Status: AC

## 2019-06-16 MED ORDER — DOCUSATE SODIUM 100 MG PO CAPS
100.0000 mg | ORAL_CAPSULE | Freq: Two times a day (BID) | ORAL | Status: DC
  Administered 2019-06-16 – 2019-06-17 (×2): 100 mg via ORAL
  Filled 2019-06-16 (×2): qty 1

## 2019-06-16 MED ORDER — METHOCARBAMOL 1000 MG/10ML IJ SOLN
500.0000 mg | Freq: Four times a day (QID) | INTRAVENOUS | Status: DC | PRN
  Filled 2019-06-16: qty 5

## 2019-06-16 MED ORDER — METHYLPREDNISOLONE ACETATE 40 MG/ML IJ SUSP
INTRAMUSCULAR | Status: AC
  Filled 2019-06-16: qty 1

## 2019-06-16 MED ORDER — ONDANSETRON HCL 4 MG PO TABS: 4.0000 mg | ORAL_TABLET | Freq: Four times a day (QID) | ORAL | Status: DC | PRN

## 2019-06-16 MED ORDER — MIDAZOLAM HCL 2 MG/2ML IJ SOLN
INTRAMUSCULAR | Status: AC
  Filled 2019-06-16: qty 2

## 2019-06-16 MED ORDER — CARBAMIDE PEROXIDE 6.5 % OT SOLN
5.0000 [drp] | Freq: Two times a day (BID) | OTIC | Status: DC
  Administered 2019-06-16 – 2019-06-17 (×2): 5 [drp] via OTIC
  Filled 2019-06-16: qty 15

## 2019-06-16 SURGICAL SUPPLY — 84 items
AGENT HMST KT MTR STRL THRMB (HEMOSTASIS)
APL SKNCLS STERI-STRIP NONHPOA (GAUZE/BANDAGES/DRESSINGS) ×1
APL SRG 60D 8 XTD TIP BNDBL (TIP) ×1
BENZOIN TINCTURE PRP APPL 2/3 (GAUZE/BANDAGES/DRESSINGS) ×2 IMPLANT
BUR PRECISION FLUTE 5.0 (BURR) ×3 IMPLANT
CABLE BIPOLOR RESECTION CORD (MISCELLANEOUS) ×3 IMPLANT
CANISTER SUCT 3000ML PPV (MISCELLANEOUS) ×3 IMPLANT
CARTRIDGE OIL MAESTRO DRILL (MISCELLANEOUS) ×1 IMPLANT
CLOSURE STERI-STRIP 1/2X4 (GAUZE/BANDAGES/DRESSINGS) ×1
CLOSURE WOUND 1/2 X4 (GAUZE/BANDAGES/DRESSINGS)
CLSR STERI-STRIP ANTIMIC 1/2X4 (GAUZE/BANDAGES/DRESSINGS) ×1 IMPLANT
COVER SURGICAL LIGHT HANDLE (MISCELLANEOUS) ×3 IMPLANT
COVER WAND RF STERILE (DRAPES) ×3 IMPLANT
DIFFUSER DRILL AIR PNEUMATIC (MISCELLANEOUS) ×3 IMPLANT
DRAIN CHANNEL 15F RND FF W/TCR (WOUND CARE) IMPLANT
DRAPE POUCH INSTRU U-SHP 10X18 (DRAPES) ×6 IMPLANT
DRAPE SURG 17X23 STRL (DRAPES) ×12 IMPLANT
DURAPREP 26ML APPLICATOR (WOUND CARE) ×3 IMPLANT
DURASEAL APPLICATOR TIP (TIP) ×2 IMPLANT
DURASEAL SPINE SEALANT 3ML (MISCELLANEOUS) ×2 IMPLANT
ELECT BLADE 4.0 EZ CLEAN MEGAD (MISCELLANEOUS) ×3
ELECT CAUTERY BLADE 6.4 (BLADE) ×3 IMPLANT
ELECT REM PT RETURN 9FT ADLT (ELECTROSURGICAL) ×3
ELECTRODE BLDE 4.0 EZ CLN MEGD (MISCELLANEOUS) ×1 IMPLANT
ELECTRODE REM PT RTRN 9FT ADLT (ELECTROSURGICAL) ×1 IMPLANT
EVACUATOR SILICONE 100CC (DRAIN) IMPLANT
FILTER STRAW FLUID ASPIR (MISCELLANEOUS) ×3 IMPLANT
GAUZE 4X4 16PLY RFD (DISPOSABLE) ×6 IMPLANT
GAUZE SPONGE 4X4 12PLY STRL (GAUZE/BANDAGES/DRESSINGS) ×3 IMPLANT
GAUZE SPONGE 4X4 12PLY STRL LF (GAUZE/BANDAGES/DRESSINGS) ×2 IMPLANT
GLOVE BIO SURGEON STRL SZ7 (GLOVE) ×3 IMPLANT
GLOVE BIO SURGEON STRL SZ8 (GLOVE) ×3 IMPLANT
GLOVE BIOGEL PI IND STRL 7.0 (GLOVE) ×1 IMPLANT
GLOVE BIOGEL PI IND STRL 8 (GLOVE) ×1 IMPLANT
GLOVE BIOGEL PI INDICATOR 7.0 (GLOVE) ×2
GLOVE BIOGEL PI INDICATOR 8 (GLOVE) ×2
GOWN STRL REUS W/ TWL LRG LVL3 (GOWN DISPOSABLE) ×1 IMPLANT
GOWN STRL REUS W/ TWL XL LVL3 (GOWN DISPOSABLE) ×2 IMPLANT
GOWN STRL REUS W/TWL LRG LVL3 (GOWN DISPOSABLE) ×3
GOWN STRL REUS W/TWL XL LVL3 (GOWN DISPOSABLE) ×6
IV CATH 14GX2 1/4 (CATHETERS) ×3 IMPLANT
KIT BASIN OR (CUSTOM PROCEDURE TRAY) ×3 IMPLANT
KIT POSITION SURG JACKSON T1 (MISCELLANEOUS) ×3 IMPLANT
KIT TURNOVER KIT B (KITS) ×3 IMPLANT
NDL 18GX1X1/2 (RX/OR ONLY) (NEEDLE) ×1 IMPLANT
NDL HYPO 25GX1X1/2 BEV (NEEDLE) ×1 IMPLANT
NDL SPNL 18GX3.5 QUINCKE PK (NEEDLE) ×2 IMPLANT
NEEDLE 18GX1X1/2 (RX/OR ONLY) (NEEDLE) ×3 IMPLANT
NEEDLE 22X1 1/2 (OR ONLY) (NEEDLE) ×3 IMPLANT
NEEDLE HYPO 25GX1X1/2 BEV (NEEDLE) ×3 IMPLANT
NEEDLE SPNL 18GX3.5 QUINCKE PK (NEEDLE) ×6 IMPLANT
NS IRRIG 1000ML POUR BTL (IV SOLUTION) ×3 IMPLANT
OIL CARTRIDGE MAESTRO DRILL (MISCELLANEOUS) ×3
PACK LAMINECTOMY ORTHO (CUSTOM PROCEDURE TRAY) ×3 IMPLANT
PACK UNIVERSAL I (CUSTOM PROCEDURE TRAY) ×3 IMPLANT
PAD ARMBOARD 7.5X6 YLW CONV (MISCELLANEOUS) ×6 IMPLANT
PATTIES SURGICAL .5 X.5 (GAUZE/BANDAGES/DRESSINGS) ×2 IMPLANT
PATTIES SURGICAL .5 X1 (DISPOSABLE) ×3 IMPLANT
SPONGE INTESTINAL PEANUT (DISPOSABLE) ×3 IMPLANT
SPONGE SURGIFOAM ABS GEL 100 (HEMOSTASIS) ×3 IMPLANT
SPONGE SURGIFOAM ABS GEL SZ50 (HEMOSTASIS) ×3 IMPLANT
STRIP CLOSURE SKIN 1/2X4 (GAUZE/BANDAGES/DRESSINGS) IMPLANT
SURGIFLO W/THROMBIN 8M KIT (HEMOSTASIS) IMPLANT
SUT MNCRL AB 4-0 PS2 18 (SUTURE) ×3 IMPLANT
SUT PROLENE 4 0 PS 2 18 (SUTURE) ×4 IMPLANT
SUT PROLENE 5 0 C1 (SUTURE) ×2 IMPLANT
SUT PROLENE 5 0 RB 2 (SUTURE) ×2 IMPLANT
SUT VIC AB 0 CT1 18XCR BRD 8 (SUTURE) IMPLANT
SUT VIC AB 0 CT1 27 (SUTURE)
SUT VIC AB 0 CT1 27XBRD ANBCTR (SUTURE) IMPLANT
SUT VIC AB 0 CT1 8-18 (SUTURE)
SUT VIC AB 1 CT1 18XCR BRD 8 (SUTURE) ×1 IMPLANT
SUT VIC AB 1 CT1 8-18 (SUTURE) ×3
SUT VIC AB 2-0 CT2 18 VCP726D (SUTURE) ×3 IMPLANT
SYR 20ML LL LF (SYRINGE) ×3 IMPLANT
SYR BULB IRRIGATION 50ML (SYRINGE) ×3 IMPLANT
SYR CONTROL 10ML LL (SYRINGE) ×6 IMPLANT
SYR TB 1ML 25GX5/8 (SYRINGE) ×6 IMPLANT
SYR TB 1ML LUER SLIP (SYRINGE) ×6 IMPLANT
TAPE CLOTH SURG 6X10 WHT LF (GAUZE/BANDAGES/DRESSINGS) ×2 IMPLANT
TOWEL GREEN STERILE (TOWEL DISPOSABLE) ×3 IMPLANT
TOWEL GREEN STERILE FF (TOWEL DISPOSABLE) ×3 IMPLANT
WATER STERILE IRR 1000ML POUR (IV SOLUTION) ×3 IMPLANT
YANKAUER SUCT BULB TIP NO VENT (SUCTIONS) ×3 IMPLANT

## 2019-06-16 NOTE — H&P (Signed)
PREOPERATIVE H&P  Chief Complaint: Right leg pain  HPI: [name] [name] is a 29 y.o. male who presents with ongoing pain in the right leg.  The patient began having pain about 6 years ago, but the pain did increase substantially 2 months ago.  MRI reveals a prominent central/right L4-L5 disc herniation, compressing the traversing right L5 nerve.this does appear to be a chronic herniation.  Patient has failed multiple forms of conservative care and continues to have pain (see office notes for additional details regarding the patient's full course of treatment)  Past Medical History:  Diagnosis Date   Anxiety    Bipolar disorder (HCC)    Past Surgical History:  Procedure Laterality Date   NO PAST SURGERIES     Social History   Socioeconomic History   Marital status: Single    Spouse name: Not on file   Number of children: Not on file   Years of education: Not on file   Highest education level: Not on file  Occupational History   Not on file  Tobacco Use   Smoking status: Current Every Day Smoker    Packs/day: 1.00    Years: 10.00    Pack years: 10.00    Types: Cigarettes   Smokeless tobacco: Never Used  Substance and Sexual Activity   Alcohol use: Not Currently   Drug use: Not Currently   Sexual activity: Not on file  Other Topics Concern   Not on file  Social History Narrative   Not on file   Social Determinants of Health   Financial Resource Strain:    Difficulty of Paying Living Expenses: Not on file  Food Insecurity:    Worried About Running Out of Food in the Last Year: Not on file   Ran Out of Food in the Last Year: Not on file  Transportation Needs:    Lack of Transportation (Medical): Not on file   Lack of Transportation (Non-Medical): Not on file  Physical Activity:    Days of Exercise per Week: Not on file   Minutes of Exercise per Session: Not on file  Stress:    Feeling of Stress : Not on file  Social Connections:    Frequency of  Communication with Friends and Family: Not on file   Frequency of Social Gatherings with Friends and Family: Not on file   Attends Religious Services: Not on file   Active Member of Clubs or Organizations: Not on file   Attends Banker Meetings: Not on file   Marital Status: Not on file   No family history on file. No Known Allergies Prior to Admission medications   Medication Sig Start Date End Date Taking? Authorizing Provider  ARIPiprazole (ABILIFY) 10 MG tablet Take 1 tablet (10 mg total) by mouth at bedtime. For mood control 05/02/19  Yes Nwoko, Nicole Kindred I, NP  buPROPion (WELLBUTRIN XL) 150 MG 24 hr tablet Take 1 tablet (150 mg total) by mouth daily. For depression 05/02/19  Yes Nwoko, Nicole Kindred I, NP  carbamide peroxide (DEBROX) 6.5 % OTIC solution Place 5 drops into the left ear 2 (two) times daily. For ear wax removal 05/01/19  Yes Nwoko, Nicole Kindred I, NP  ibuprofen (ADVIL) 800 MG tablet Take 800 mg by mouth 3 (three) times daily as needed. 05/06/19  Yes [provider]  lithium carbonate (LITHOBID) 300 MG CR tablet Take 600 mg by mouth 2 (two) times daily. 05/21/19  Yes [provider]  traZODone (DESYREL) 50 MG  tablet Take 1 tablet (50 mg total) by mouth at bedtime as needed for sleep. 05/01/19  Yes [name] I, NP  lidocaine (LIDODERM) 5 % Place 1 patch onto the skin daily as needed. Remove & Discard patch within 12 hours or as directed by MD: For pain Patient not taking: Reported on 05/28/2019 05/01/19   [name] I, NP  lithium 600 MG capsule Take 1 capsule (600 mg total) by mouth 2 (two) times daily with a meal. For mood stabilization Patient not taking: Reported on 05/28/2019 05/01/19   [name] I, NP  nicotine polacrilex (NICORETTE) 2 MG gum Take 1 each (2 mg total) by mouth as needed. For smoking cessation Patient not taking: Reported on 05/28/2019 05/01/19   [name] I, NP     All other systems have been reviewed and were otherwise negative with  the exception of those mentioned in the HPI and as above.  Physical Exam: There were no vitals filed for this visit.  There is no height or weight on file to calculate BMI.  General: Alert, no acute distress Cardiovascular: No pedal edema Respiratory: No cyanosis, no use of accessory musculature Skin: No lesions in the area of chief complaint Neurologic: Sensation intact distally Psychiatric: Patient is competent for consent with normal mood and affect Lymphatic: No axillary or cervical lymphadenopathy  MUSCULOSKELETAL: The patient is noted to have a markedly positive straight leg raise on the right  Assessment/Plan: RIGHT- SIDED LUMBAR 5 RADICULOPATHY WITH A LARGE CENTRAL/RIGHT LUMBAR 4-5 [place] for Procedure(s): RIGHT- SIDED LUMBAR 4-5 [place], MD 06/16/2019 7:40 AM

## 2019-06-16 NOTE — Op Note (Signed)
PATIENT NAME: [name] [name]   MEDICAL RECORD NO.:   [id number]   DATE OF BIRTH: [birth year]   DATE OF PROCEDURE: 06/16/2019                                     OPERATIVE REPORT     PREOPERATIVE DIAGNOSES: 1. Right-sided L5 radiculopathy. 2. Right-sided L4/5 disk herniation causing severe compression of the thecal sac and severe     compression of the right L5 nerve.   POSTOPERATIVE DIAGNOSES: 1. Right-sided L5 radiculopathy. 2. Right-sided L4/5 disk herniation causing severe compression of the thecal sac and severe     compression of the right L5 nerve.   PROCEDURES:    1. Right-sided L4/5 laminotomy with partial facetectomy and removal of herniated right-sided L4/5 disk fragment.  2.  Durotomy repair using 5-0 Prolene   SURGEON:  Estill Bamberg, MD.   ASSISTANTJason Coop, PA-C.   ANESTHESIA:  General endotracheal anesthesia.   COMPLICATIONS:  Incidental durotomy, repaired uneventfully   DISPOSITION:  Stable.   ESTIMATED BLOOD LOSS:  Minimal.   INDICATIONS FOR SURGERY:  Briefly, Mr. Bougher is a 29 year old male, who did present to me with severe pain in the right leg.  The patient's MRI did reveal the findings outlined above.  His MRI did strongly suggest that his pain was coming from the L4-5 herniated disc fragment.    He was also noted to have bilateral facet hypertrophy, contributing to his severe spinal stenosis.  As such, we did ultimately elect to proceed with the procedure reflected above.  The patient was fully made aware of the risks of surgery, including the risk of recurrent herniation and the need for subsequent surgery, including the possibility of a subsequent diskectomy and/or fusion.   OPERATIVE DETAILS:  On 06/16/2019, the patient was brought to surgery and general endotracheal anesthesia was administered.  The patient was placed prone on a well-padded flat Jackson bed with a spinal frame.  Antibiotics were given.  The back was prepped and  draped and a time-out procedure was performed.  At this point, a midline incision was made directly over the L4/5 intervertebral space.  A curvilinear incision was made just to the right of the midline into the fascia.  A self-retaining McCulloch retractor was placed.  The lamina of L4 and L5 was identified and subperiosteally exposed.  I then removed the lateral aspect of the L4/5 ligamentum flavum.  Readily identified was the dura and traversing right L5 nerve.  The nerve was was noted to be under obvious tension, and was noted to be very erythematous.  The thecal sac was clearly under his very severe compression. I was therefore very meticulous in thoroughly exposing the lateral aspect of the thecal sac and traversing right L5 nerve. I did perform a liberal partial facetectomy, with removal of the medial aspect of the L4 lamina, and the medial inferior aspect of the L5 lamina.  This was a very meticulous portion of the procedure, as the dura was noted to be under obvious tension throughout. Upon my exposure, while exposing the region of the medial border of the L5 pedicle and thecal sac, a small durotomy was encountered. This was thought to be secondary to a small bony spicule emanating from the superior aspect of the L5 lamina, in combination with the significant compression of the thecal sac.  At this portion of the procedure, I did place  Gelfoam over the durotomy site and proceeded with the discectomy. I was able to gently gain medial retraction of the traversing right L5 nerve nerve, and in doing so, a large herniated disk fragment was readily noted. The disc herniation was uneventfully removed in multiple fragments. It was noted to be chronic-appearing. At this point, the traversing right L5 nerve was evaluated, and was noted to be free and mobile, and entirely free of any compression. I was very pleased with the final decompression that I was able to accomplish.   At this point, I extended the  laminectomy inferiorly, in order to thoroughly and adequately expose the region of the durotomy.  The margins of the durotomy were noted, and the durotomy was repaired using 5-0 Prolene.  I did supplement the repair using Surgi-Flo.  Gelfoam was placed over the repair site as well.  I did asked the CRNA to perform a Valsalva maneuver.  This was performed up to 30 mm of water, and there is no extravasation of cerebrospinal fluid noted. At this point, the wound was copiously irrigated with normal saline.  All epidural bleeding was controlled using bipolar electrocautery in addition to Surgiflo. All bleeding was controlled at the termination of the procedure. The wound was then closed in layers using #1 Vicryl followed by 2-0 Vicryl, followed by 4-0 Monocryl. Benzoin and Steri-Strips were applied followed by a sterile dressing. All instrument counts were correct at the termination of the procedure.   Of note, Pricilla Holm was my assistant throughout surgery, and did aid in retraction, suctioning, and closure from start to finish.      Phylliss Bob, MD

## 2019-06-16 NOTE — Transfer of Care (Signed)
Immediate Anesthesia Transfer of Care Note  Patient: [name] [name]  Procedure(s) Performed: RIGHT- SIDED LUMBAR FOUR TO FIVE MICRODISECTOMY (Right Spine Lumbar)  Patient Location: PACU  Anesthesia Type:General  Level of Consciousness: awake  Airway & Oxygen Therapy: Patient Spontanous Breathing and Patient connected to face mask oxygen  Post-op Assessment: Report given to RN and Post -op Vital signs reviewed and stable  Post vital signs: Reviewed and stable  Last Vitals:  Vitals Value Taken Time  BP 160/86 06/16/19 1748  Temp    Pulse 115 06/16/19 1749  Resp 29 06/16/19 1749  SpO2 97 % 06/16/19 1749  Vitals shown include unvalidated device data.  Last Pain:  Vitals:   06/16/19 1138  TempSrc:   PainSc: 7       Patients Stated Pain Goal: 3 (06/16/19 1138)  Complications: No apparent anesthesia complications

## 2019-06-16 NOTE — Anesthesia Preprocedure Evaluation (Addendum)
Anesthesia Evaluation  Patient identified by MRN, date of birth, ID band Patient awake    Reviewed: Allergy & Precautions, NPO status , Patient's Chart, lab work & pertinent test results  Airway Mallampati: III  TM Distance: >3 FB Neck ROM: Full    Dental no notable dental hx. (+) Dental Advisory Given   Pulmonary neg pulmonary ROS, Current Smoker,    Pulmonary exam normal        Cardiovascular negative cardio ROS Normal cardiovascular exam     Neuro/Psych PSYCHIATRIC DISORDERS Anxiety Depression Bipolar Disorder negative neurological ROS     GI/Hepatic negative GI ROS, Neg liver ROS,   Endo/Other  negative endocrine ROS  Renal/GU negative Renal ROS     Musculoskeletal negative musculoskeletal ROS (+)   Abdominal   Peds  Hematology negative hematology ROS (+)   Anesthesia Other Findings Day of surgery medications reviewed with the patient.  Reproductive/Obstetrics                            Anesthesia Physical Anesthesia Plan  ASA: II  Anesthesia Plan: General   Post-op Pain Management:    Induction: Intravenous  PONV Risk Score and Plan: Ondansetron, Dexamethasone and Midazolam  Airway Management Planned: Oral ETT  Additional Equipment:   Intra-op Plan:   Post-operative Plan: Extubation in OR  Informed Consent: I have reviewed the patients History and Physical, chart, labs and discussed the procedure including the risks, benefits and alternatives for the proposed anesthesia with the patient or authorized representative who has indicated his/her understanding and acceptance.     Dental advisory given  Plan Discussed with: Anesthesiologist  Anesthesia Plan Comments:        Anesthesia Quick Evaluation

## 2019-06-16 NOTE — Anesthesia Procedure Notes (Signed)
Procedure Name: Intubation Performed by: Waynard Edwards, CRNA Pre-anesthesia Checklist: Patient identified, Emergency Drugs available, Suction available and Patient being monitored Patient Re-evaluated:Patient Re-evaluated prior to induction Oxygen Delivery Method: Circle System Utilized Preoxygenation: Pre-oxygenation with 100% oxygen Induction Type: IV induction Ventilation: Two handed mask ventilation required and Oral airway inserted - appropriate to patient size Laryngoscope Size: Hyacinth Meeker and 2 Grade View: Grade II Tube type: Oral Tube size: 7.5 mm Number of attempts: 1 Airway Equipment and Method: Stylet and Oral airway Placement Confirmation: ETT inserted through vocal cords under direct vision,  positive ETCO2 and breath sounds checked- equal and bilateral Secured at: 23 cm Tube secured with: Tape Dental Injury: Teeth and Oropharynx as per pre-operative assessment

## 2019-06-17 ENCOUNTER — Encounter: Payer: Self-pay | Admitting: *Deleted

## 2019-06-17 DIAGNOSIS — M5116 Intervertebral disc disorders with radiculopathy, lumbar region: Secondary | ICD-10-CM | POA: Diagnosis not present

## 2019-06-17 MED ORDER — METHOCARBAMOL 500 MG PO TABS: 500.0000 mg | ORAL_TABLET | Freq: Four times a day (QID) | ORAL | 1 refills | Status: AC | PRN

## 2019-06-17 MED ORDER — ACETAMINOPHEN 325 MG PO TABS: 650.0000 mg | ORAL_TABLET | ORAL | 1 refills | Status: DC | PRN

## 2019-06-17 NOTE — Plan of Care (Signed)
Patient alert and oriented, mae's well, voiding adequate amount of urine, swallowing without difficulty, no c/o pain at time of discharge. Patient discharged home with family. Script and discharged instructions given to patient. Patient and family stated understanding of instructions given. Patient has an appointment with Dr. Dumonski 

## 2019-06-17 NOTE — Progress Notes (Signed)
Patient Ist voids of clear urine at 0845 and another at 0945. No discomfort noted. Patient ready to be discharged home. MD aware

## 2019-06-17 NOTE — Anesthesia Postprocedure Evaluation (Signed)
Anesthesia Post Note  Patient: Jocob Dambach  Procedure(s) Performed: RIGHT- SIDED LUMBAR FOUR TO FIVE MICRODISECTOMY (Right Spine Lumbar)     Patient location during evaluation: PACU Anesthesia Type: General Level of consciousness: awake and alert Pain management: pain level controlled Vital Signs Assessment: post-procedure vital signs reviewed and stable Respiratory status: spontaneous breathing, nonlabored ventilation, respiratory function stable and patient connected to nasal cannula oxygen Cardiovascular status: blood pressure returned to baseline and stable Postop Assessment: no apparent nausea or vomiting Anesthetic complications: no    Last Vitals:  Vitals:   06/17/19 0413 06/17/19 0742  BP: 110/61 115/68  Pulse: 74 85  Resp: 20 16  Temp: 36.8 C 36.8 C  SpO2: 98% 99%    Last Pain:  Vitals:   06/17/19 0742  TempSrc: Oral  PainSc:                  Shelton Silvas

## 2019-06-17 NOTE — Progress Notes (Signed)
    Patient doing well  Patient denies headaches after sitting up at edge of bed Patient denies leg pain or back pain   Physical Exam: Vitals:   06/16/19 2343 06/17/19 0413  BP: (!) 112/51 110/61  Pulse: 74 74  Resp: (!) 22 20  Temp: 98.2 F (36.8 C) 98.2 F (36.8 C)  SpO2: 98% 98%   Patient sitting at edge or bed - appears comfortable Dressing in place NVI  POD #1 s/p right L4/5 microdiscectomy and dural repair  - encourage ambulation - catheter will be removed - Tylenol for pain, Valium for muscle spasms - d/c today with f/u in 2 weeks

## 2019-08-04 ENCOUNTER — Other Ambulatory Visit: Payer: Self-pay

## 2019-08-04 ENCOUNTER — Observation Stay (HOSPITAL_BASED_OUTPATIENT_CLINIC_OR_DEPARTMENT_OTHER)
Admission: AD | Admit: 2019-08-04 | Discharge: 2019-08-05 | Disposition: A | Discharge status: Home / Self Care | Payer: Medicaid Other | Source: Intra-hospital | Attending: Psychiatry | Admitting: Psychiatry

## 2019-08-04 ENCOUNTER — Encounter (HOSPITAL_COMMUNITY): Payer: Self-pay | Admitting: Registered Nurse

## 2019-08-04 ENCOUNTER — Emergency Department (HOSPITAL_COMMUNITY)
Admission: EM | Admit: 2019-08-04 | Discharge: 2019-08-04 | Disposition: A | Discharge status: Other Acute Inpatient Hospital | Payer: Medicaid Other | Attending: Emergency Medicine | Admitting: Emergency Medicine

## 2019-08-04 ENCOUNTER — Encounter (HOSPITAL_COMMUNITY): Payer: Self-pay

## 2019-08-04 DIAGNOSIS — Z20822 Contact with and (suspected) exposure to covid-19: Secondary | ICD-10-CM | POA: Insufficient documentation

## 2019-08-04 DIAGNOSIS — Z79899 Other long term (current) drug therapy: Secondary | ICD-10-CM | POA: Insufficient documentation

## 2019-08-04 DIAGNOSIS — F17210 Nicotine dependence, cigarettes, uncomplicated: Secondary | ICD-10-CM | POA: Insufficient documentation

## 2019-08-04 DIAGNOSIS — F332 Major depressive disorder, recurrent severe without psychotic features: Secondary | ICD-10-CM | POA: Insufficient documentation

## 2019-08-04 DIAGNOSIS — R45851 Suicidal ideations: Secondary | ICD-10-CM | POA: Insufficient documentation

## 2019-08-04 DIAGNOSIS — F99 Mental disorder, not otherwise specified: Secondary | ICD-10-CM | POA: Diagnosis present

## 2019-08-04 LAB — CBC
HCT: 48.8 % (ref 39.0–52.0)
Hemoglobin: 15.8 g/dL (ref 13.0–17.0)
MCH: 29.9 pg (ref 26.0–34.0)
MCHC: 32.4 g/dL (ref 30.0–36.0)
MCV: 92.2 fL (ref 80.0–100.0)
Platelets: 266 10*3/uL (ref 150–400)
RBC: 5.29 MIL/uL (ref 4.22–5.81)
RDW: 12.9 % (ref 11.5–15.5)
WBC: 8 10*3/uL (ref 4.0–10.5)
nRBC: 0 % (ref 0.0–0.2)

## 2019-08-04 LAB — RESPIRATORY PANEL BY RT PCR (FLU A&B, COVID)
Influenza A by PCR: NEGATIVE
Influenza B by PCR: NEGATIVE
SARS Coronavirus 2 by RT PCR: NEGATIVE

## 2019-08-04 LAB — COMPREHENSIVE METABOLIC PANEL
ALT: 20 U/L (ref 0–44)
AST: 19 U/L (ref 15–41)
Albumin: 4.1 g/dL (ref 3.5–5.0)
Alkaline Phosphatase: 44 U/L (ref 38–126)
Anion gap: 8 (ref 5–15)
BUN: 12 mg/dL (ref 6–20)
CO2: 23 mmol/L (ref 22–32)
Calcium: 9.2 mg/dL (ref 8.9–10.3)
Chloride: 108 mmol/L (ref 98–111)
Creatinine, Ser: 0.7 mg/dL (ref 0.61–1.24)
GFR calc Af Amer: >60 mL/min (ref >60)
GFR calc non Af Amer: >60 mL/min (ref >60)
Glucose, Bld: 115 mg/dL — ABNORMAL HIGH (ref 70–99)
Potassium: 4.1 mmol/L (ref 3.5–5.1)
Sodium: 139 mmol/L (ref 135–145)
Total Bilirubin: 0.6 mg/dL (ref 0.3–1.2)
Total Protein: 7.4 g/dL (ref 6.5–8.1)

## 2019-08-04 LAB — RAPID URINE DRUG SCREEN, HOSP PERFORMED
Amphetamines: NOT DETECTED
Barbiturates: NOT DETECTED
Benzodiazepines: NOT DETECTED
Cocaine: NOT DETECTED
Opiates: NOT DETECTED
Tetrahydrocannabinol: POSITIVE — AB

## 2019-08-04 LAB — SALICYLATE LEVEL: Salicylate Lvl: <7 mg/dL — ABNORMAL LOW (ref 7.0–30.0)

## 2019-08-04 LAB — ETHANOL: Alcohol, Ethyl (B): <10 mg/dL (ref <10)

## 2019-08-04 LAB — ACETAMINOPHEN LEVEL: Acetaminophen (Tylenol), Serum: <10 ug/mL — ABNORMAL LOW (ref 10–30)

## 2019-08-04 MED ORDER — TRAZODONE HCL 50 MG PO TABS
50.0000 mg | ORAL_TABLET | Freq: Every evening | ORAL | Status: DC | PRN
  Administered 2019-08-04: 50 mg via ORAL
  Filled 2019-08-04: qty 1

## 2019-08-04 MED ORDER — ACETAMINOPHEN 325 MG PO TABS
650.0000 mg | ORAL_TABLET | Freq: Four times a day (QID) | ORAL | Status: DC | PRN
  Administered 2019-08-04: 650 mg via ORAL
  Filled 2019-08-04: qty 2

## 2019-08-04 MED ORDER — MAGNESIUM HYDROXIDE 400 MG/5ML PO SUSP: 30.0000 mL | Freq: Every day | ORAL | Status: DC | PRN

## 2019-08-04 MED ORDER — ALUM & MAG HYDROXIDE-SIMETH 200-200-20 MG/5ML PO SUSP: 30.0000 mL | ORAL | Status: DC | PRN

## 2019-08-04 MED ORDER — HYDROXYZINE HCL 25 MG PO TABS
25.0000 mg | ORAL_TABLET | Freq: Three times a day (TID) | ORAL | Status: DC | PRN
  Administered 2019-08-04: 25 mg via ORAL
  Filled 2019-08-04: qty 1

## 2019-08-04 NOTE — BH Assessment (Signed)
Tele Assessment Note   Patient Name: [name] [name] MRN: [id number] Referring Physician: Sherry Ruffing Gwenyth Allegra MD Location of Patient: [facility] Location of Provider: [place]  [name] [name] is an 29 y.o. male present to [facility] with complaint of suicidal ideations with no plan and depression. Report suicidal thoughts has been be present since last night, no known trigger. Patient released from [facility] a month ago denies being on medication for depression. Patient has history of MDD and Bipolar Disorder and has been in and out of hospitals since 2009-06-2007. Patient father died 2018/06/29, he continue to grieve his father. Patient has attempted to suicide 20-30 times by cutting, hanging etc. Patient has been living in [facility] for the past 1-months.   Patient pleasant during assessment. Patient orientated x4, spoke logically and denied homicidal ideations, denied auditory/visual hallucinations. Patient  Affect appropriate and pleasant and thought process relevant.  Disposition: Shuvon Rankin, NP, recommend overnight observation    Diagnosis: F33.2, Major depressive disorder, Recurrent episode, Severe  Past Medical History:  Past Medical History:  Diagnosis Date  . Anxiety   . Bipolar disorder [facility])     Past Surgical History:  Procedure Laterality Date  . BACK SURGERY    . LUMBAR LAMINECTOMY/DECOMPRESSION MICRODISCECTOMY Right 06/16/2019   Procedure: RIGHT- SIDED LUMBAR FOUR TO FIVE MICRODISECTOMY;  Surgeon: Phylliss Bob, MD;  Location: [place];  Service: Orthopedics;  Laterality: Right;  . NO PAST SURGERIES      Family History:  Family History  Family history unknown: Yes    Social History:  reports that he has been smoking cigarettes. He has a 10.00 pack-year smoking history. He has never used smokeless tobacco. He reports previous alcohol use. He reports previous drug use.  Additional Social History:  Alcohol / Drug Use Pain Medications: see  MAR Prescriptions: see MAR Over the Counter: see MAR History of alcohol / drug use?: Yes Substance #1 Name of Substance 1: THC 1 - Age of First Use: 13 1 - Amount (size/oz): 2 blunts daily 1 - Frequency: daily 1 - Duration: 15 years 1 - Last Use / Amount: 08/04/2019  CIWA: CIWA-Ar BP: 138/89 Pulse Rate: 63 COWS:    Allergies: No Known Allergies  Home Medications: (Not in a hospital admission)   OB/GYN Status:  No LMP for male patient.  General Assessment Data [facility] of [facility]: [facility] TTS Assessment: In system Is this a Tele or Face-to-Face Assessment?: Tele Assessment Is this an Initial Assessment or a Re-assessment for this encounter?: Initial Assessment Patient Accompanied by:: N/A Language Other than English: No Living Arrangements: Other (Comment)([facility]) What gender do you identify as?: Male Marital status: Single Living Arrangements: Non-relatives/Friends Can pt return to current living arrangement?: Yes Admission Status: Voluntary Is patient capable of signing voluntary admission?: Yes Referral Source: Self/Family/Friend Insurance type: Medicaid      Crisis Care Plan Living Arrangements: Non-relatives/Friends Legal Guardian: Other:(self) Name of Psychiatrist: none reported  Name of Therapist: None reported   Education Status Is patient currently in school?: No  Risk to self with the past 6 months Suicidal Ideation: No-Not Currently/Within Last 6 Months(report SI with no plan ) Has patient been a risk to self within the past 6 months prior to admission? : Yes Suicidal Intent: No Has patient had any suicidal intent within the past 6 months prior to admission? : No Is patient at risk for suicide?: No Suicidal Plan?: No Has patient had any suicidal plan within the past  6 months prior to admission? : No Access to Means: No What has been your use of drugs/alcohol within the last 12 months?: THC  Previous Attempts/Gestures: Yes How  many times?: (unknown) Other Self Harm Risks: denied  Triggers for Past Attempts: Unknown Intentional Self Injurious Behavior: None Family Suicide History: No Recent stressful life event(s): Other (Comment)(denied ) Persecutory voices/beliefs?: No Depression: Yes Depression Symptoms: Loss of interest in usual pleasures Substance abuse history and/or treatment for substance abuse?: No Suicide prevention information given to non-admitted patients: Not applicable  Risk to Others within the past 6 months Homicidal Ideation: No Does patient have any lifetime risk of violence toward others beyond the six months prior to admission? : No Thoughts of Harm to Others: No Current Homicidal Intent: No Current Homicidal Plan: No Access to Homicidal Means: No Identified Victim: n/a History of harm to others?: No Assessment of Violence: None Noted Violent Behavior Description: denied  Does patient have access to weapons?: No Criminal Charges Pending?: No Does patient have a court date: No Is patient on probation?: No  Psychosis Hallucinations: None noted Delusions: None noted  Mental Status Report Appearance/Hygiene: In scrubs Eye Contact: Good Motor Activity: Freedom of movement Speech: Logical/coherent Level of Consciousness: Alert Mood: Pleasant Affect: Appropriate to circumstance Anxiety Level: None Thought Processes: Coherent, Relevant Judgement: Impaired Orientation: Person, Place, Time, Situation Obsessive Compulsive Thoughts/Behaviors: None  Cognitive Functioning Concentration: Normal Memory: Recent Intact, Remote Intact Is patient IDD: No Insight: Fair Impulse Control: Fair Appetite: Fair Have you had any weight changes? : No Change Sleep: No Change Total Hours of Sleep: (5) Vegetative Symptoms: None  ADLScreening [facility] Assessment Services) Patient's cognitive ability adequate to safely complete daily activities?: Yes Patient able to express need for assistance  with ADLs?: Yes Independently performs ADLs?: Yes (appropriate for developmental age)  Prior Inpatient Therapy Prior Inpatient Therapy: Yes Prior Therapy Dates: 06/2019 Prior Therapy Facilty/Provider(s): [facility] Reason for Treatment: mental health   Prior Outpatient Therapy Prior Outpatient Therapy: No Does patient have an ACCT team?: No Does patient have Intensive In-House Services?  : No Does patient have [facility] services? : No Does patient have P4CC services?: No  ADL Screening (condition at time of admission) Patient's cognitive ability adequate to safely complete daily activities?: Yes Is the patient deaf or have difficulty hearing?: No Does the patient have difficulty seeing, even when wearing glasses/contacts?: No Does the patient have difficulty concentrating, remembering, or making decisions?: No Patient able to express need for assistance with ADLs?: Yes Does the patient have difficulty dressing or bathing?: No Independently performs ADLs?: Yes (appropriate for developmental age) Does the patient have difficulty walking or climbing stairs?: No       Abuse/Neglect Assessment (Assessment to be complete while patient is alone) Abuse/Neglect Assessment Can Be Completed: Yes Physical Abuse: Denies Verbal Abuse: Denies Sexual Abuse: Denies Exploitation of patient/patient's resources: Denies Self-Neglect: Denies     Merchant navy officer (For Healthcare) Does Patient Have a Medical Advance Directive?: No Would patient like information on creating a medical advance directive?: No - Patient declined          Disposition:  Disposition Initial Assessment Completed for this Encounter: Yes(Shuvon Rankin, NP, overnight observation )  This service was provided via telemedicine using a 2-way, interactive audio and video technology.  Names of all persons participating in this telemedicine service and their role in this encounter. Name:  [name] [name]  Role: patient   Name: Selena Batten Role: TTS   Name: Assunta Found Role: NP  Name:  Role:     Dian Situ 08/04/2019 6:46 PM

## 2019-08-04 NOTE — Progress Notes (Signed)
[name] [name] is a 29 y.o. male Voluntary admitted for suicidal ideation without plan, pt stated that he is tired of living and would not elaborate further. Pt is calm and cooperative with admission, alert and oriented x 4. Pt complained of headache 10/10, tylenol 650 mg PO given as ordrerd. Pt denied SI/HI, AVH at this time and contracted for safety. Consents signed, skin/belongings search completed and pt oriented to unit. Pt stable at this time. Pt given the opportunity to express concerns and ask questions. Pt given toiletries. Will continue to monitor.

## 2019-08-04 NOTE — Progress Notes (Signed)
Received [name] [name] at the change of shift awake in his room with the sitter at the bedside. Later at 2010 report was given to nurse Candise Bowens at [facility] and transport was notified. He was transported with his personal belonging without incident at 2131 hrs.

## 2019-08-04 NOTE — Plan of Care (Signed)
[facility] Observation Crisis Plan  Reason for Crisis Plan:  Crisis Stabilization and Substance Abuse   Plan of Care:  Referral for Substance Abuse  Family Support:      Current Living Environment:  Living Arrangements: Group Home  Insurance:   Hospital Account    Name Acct ID Class Status Primary Coverage   [name], [name] [id number] BEHAVIORAL HEALTH OBSERVATION [employer] MEDICAID - [employer] MEDICA[employer] LME        Guarantor Account (for Hospital Account [id number])    Name Relation to Pt Service Area Active? Acct Type   [name] Self [facility] Yes Behavioral Health   Address Phone       [address] [place], [place] [address] [phone](H)          Coverage Information (for Hospital Account [id number])    F/O Payor/Plan Precert #   [facility] MEDICAID/[employer] MEDICAID [facility]    Subscriber Subscriber #   [name], [name] [id number] [facility]   Address Phone   [address] [place], [place] [address] [phone]      Legal Guardian:     Primary Care Provider:  Patient, No Pcp Per  Current Outpatient Providers:  Vesta Mixer  Psychiatrist:     Counselor/Therapist:     Compliant with Medications:  No  Additional Information:   [name] 3/9/202111:08 PM

## 2019-08-04 NOTE — ED Provider Notes (Signed)
[facility]-EMERGENCY DEPT Provider Note   CSN: [id number] Arrival date & time: 08/04/19  1044     History Chief Complaint  Patient presents with  . Suicidal    Lambert Jeanty is a 29 y.o. male with a past medical history of bipolar disorder, anxiety presenting to the ED for suicidal ideation.  States that last night started having worsening suicidal ideation without specific plan.  Not tell me if there is anything stressful going on that is causing him to feel this way.  He has had prior suicide attempts and suicidal thoughts including cutting himself.  He has had several hospitalizations for the same.  States that he is post to be on lithium and Abilify but has not taken these medications since they were prescribed to him.  Denies any alcohol, tobacco, drug use or taking medications that he is not supposed to.  Denies any chest pain, abdominal pain, vomiting, shortness of breath or sick contacts with similar symptoms, auditory visual hallucinations, HI.  HPI     Past Medical History:  Diagnosis Date  . Anxiety   . Bipolar disorder [facility])     Patient Active Problem List   Diagnosis Date Noted  . Radiculopathy 06/16/2019  . Severe episode of recurrent major depressive disorder, without psychotic features (HCC)   . Cannabis dependence (HCC)   . Bipolar II disorder (HCC) 04/26/2019  . Cannabis use disorder, severe, dependence (HCC) 04/26/2019  . Substance induced mood disorder (HCC) 04/26/2019    Past Surgical History:  Procedure Laterality Date  . BACK SURGERY    . LUMBAR LAMINECTOMY/DECOMPRESSION MICRODISCECTOMY Right 06/16/2019   Procedure: RIGHT- SIDED LUMBAR FOUR TO FIVE MICRODISECTOMY;  Surgeon: Estill Bamberg, MD;  [facility];  Service: Orthopedics;  Laterality: Right;  . NO PAST SURGERIES         Family History  Family history unknown: Yes    Social History   Tobacco Use  . Smoking status: Current Every Day Smoker    Packs/day: 1.00    Years: 10.00    Pack years: 10.00    Types: Cigarettes  . Smokeless tobacco: Never Used  Substance Use Topics  . Alcohol use: Not Currently  . Drug use: Not Currently    Home Medications Prior to Admission medications   Medication Sig Start Date End Date Taking? Authorizing Provider  acetaminophen (TYLENOL) 325 MG tablet Take 2 tablets (650 mg total) by mouth every 4 (four) hours as needed for mild pain ((score 1 to 3) or temp > 100.5). 06/17/19   Estill Bamberg, MD  ARIPiprazole (ABILIFY) 10 MG tablet Take 1 tablet (10 mg total) by mouth at bedtime. For mood control 05/02/19   Armandina Stammer I, NP  buPROPion (WELLBUTRIN XL) 150 MG 24 hr tablet Take 1 tablet (150 mg total) by mouth daily. For depression 05/02/19   Armandina Stammer I, NP  carbamide peroxide (DEBROX) 6.5 % OTIC solution Place 5 drops into the left ear 2 (two) times daily. For ear wax removal 05/01/19   Armandina Stammer I, NP  lithium carbonate (LITHOBID) 300 MG CR tablet Take 600 mg by mouth 2 (two) times daily. 05/21/19   [provider]  methocarbamol (ROBAXIN) 500 MG tablet Take 1 tablet (500 mg total) by mouth every 6 (six) hours as needed for muscle spasms. 06/16/19   McKenzie, Eilene Ghazi, PA-C  methocarbamol (ROBAXIN) 500 MG tablet Take 1 tablet (500 mg total) by mouth every 6 (six) hours as needed for muscle spasms. 06/17/19  Phylliss Bob, MD  nicotine polacrilex (NICORETTE) 2 MG gum Take 1 each (2 mg total) by mouth as needed. For smoking cessation Patient not taking: Reported on 05/28/2019 05/01/19   [name] I, NP  traZODone (DESYREL) 50 MG tablet Take 1 tablet (50 mg total) by mouth at bedtime as needed for sleep. 05/01/19   Encarnacion Slates, NP    Allergies    Patient has no known allergies.  Review of Systems   Review of Systems  Constitutional: Negative for appetite change, chills and fever.  HENT: Negative for ear pain, rhinorrhea, sneezing and sore throat.   Eyes: Negative for photophobia and visual  disturbance.  Respiratory: Negative for cough, chest tightness, shortness of breath and wheezing.   Cardiovascular: Negative for chest pain and palpitations.  Gastrointestinal: Negative for abdominal pain, blood in stool, constipation, diarrhea, nausea and vomiting.  Genitourinary: Negative for dysuria, hematuria and urgency.  Musculoskeletal: Negative for myalgias.  Skin: Negative for rash.  Neurological: Negative for dizziness, weakness and light-headedness.  Psychiatric/Behavioral: Positive for suicidal ideas.    Physical Exam Updated Vital Signs BP (!) 157/100 (BP Location: Right Arm)   Pulse 96   Temp 98.4 F (36.9 C) (Oral)   Resp 16   Ht 6\' 1"  (1.854 m)   Wt 131.5 kg   SpO2 98%   BMI 38.26 kg/m   Physical Exam Vitals and nursing note reviewed.  Constitutional:      General: He is not in acute distress.    Appearance: He is well-developed.  HENT:     Head: Normocephalic and atraumatic.     Nose: Nose normal.  Eyes:     General: No scleral icterus.       Right eye: No discharge.        Left eye: No discharge.     Conjunctiva/sclera: Conjunctivae normal.  Cardiovascular:     Rate and Rhythm: Normal rate and regular rhythm.     Heart sounds: Normal heart sounds. No murmur. No friction rub. No gallop.   Pulmonary:     Effort: Pulmonary effort is normal. No respiratory distress.     Breath sounds: Normal breath sounds.  Abdominal:     General: Bowel sounds are normal. There is no distension.     Palpations: Abdomen is soft.     Tenderness: There is no abdominal tenderness. There is no guarding.  Musculoskeletal:        General: Normal range of motion.     Cervical back: Normal range of motion and neck supple.  Skin:    General: Skin is warm and dry.     Findings: No rash.  Neurological:     Mental Status: He is alert.     Motor: No abnormal muscle tone.     Coordination: Coordination normal.  Psychiatric:        Thought Content: Thought content includes  suicidal ideation. Thought content does not include homicidal ideation. Thought content does not include homicidal or suicidal plan.     ED Results / Procedures / Treatments   Labs (all labs ordered are listed, but only abnormal results are displayed) Labs Reviewed  COMPREHENSIVE METABOLIC PANEL - Abnormal; Notable for the following components:      Result Value   Glucose, Bld 115 (*)    All other components within normal limits  SALICYLATE LEVEL - Abnormal; Notable for the following components:   Salicylate Lvl <0.2 (*)    All other components within normal limits  ACETAMINOPHEN  LEVEL - Abnormal; Notable for the following components:   Acetaminophen (Tylenol), Serum <10 (*)    All other components within normal limits  RAPID URINE DRUG SCREEN, HOSP PERFORMED - Abnormal; Notable for the following components:   Tetrahydrocannabinol POSITIVE (*)    All other components within normal limits  ETHANOL  CBC    EKG None  Radiology No results found.  Procedures Procedures (including critical care time)  Medications Ordered in ED Medications - No data to display  ED Course  I have reviewed the triage vital signs and the nursing notes.  Pertinent labs & imaging results that were available during my care of the patient were reviewed by me and considered in my medical decision making (see chart for details).    MDM Rules/Calculators/A&P                      28yo M with a past medical history of bipolar disorder, anxiety presenting to ED for suicidal ideation. Reports prior suicide attempts within the past year. No specific plan today and is not disclosing any stressful events that triggered this last night. Has not been taking his Lithium or Abilify as prescribed. Denies HI, AVH or other symptoms.  Work-up significant for UDS positive for THC.  Other lab work is unremarkable.  Patient is medically cleared for TTS evaluation and disposition.  Final Clinical Impression(s) / ED  Diagnoses Final diagnoses:  Suicidal ideation    Rx / DC Orders ED Discharge Orders    None     Portions of this note were generated with Dragon dictation software. Dictation errors may occur despite best attempts at proofreading.    Dietrich Pates, PA-C 08/04/19 1224    Tegeler, Canary Brim, MD 08/04/19 [phone]

## 2019-08-04 NOTE — ED Triage Notes (Signed)
Patient states he has suiciall thoughts, but denies a plan. Patient denies any alcohol, drug use, visual and auditory hallucinations.

## 2019-08-05 DIAGNOSIS — F332 Major depressive disorder, recurrent severe without psychotic features: Secondary | ICD-10-CM

## 2019-08-05 NOTE — Discharge Summary (Signed)
Physician Discharge Summary Note  Patient:  [name] [name] is an 29 y.o., male MRN:  [id number] DOB:  12-30-1990 Patient phone:  [phone] (home)  Patient address:   [address] [place] [address],  Total Time spent with patient: 45 minutes  Date of Admission:  08/04/2019 Date of Discharge: 08/04/2019  Reason for Admission:   [name] [name] is an 29 y.o. male present to [facility] with complaint of suicidal ideations with no plan and depression. Report suicidal thoughts has been be present since last night, no known trigger. Patient released from [facility] a month ago denies being on medication for depression. Patient has history of MDD and Bipolar Disorder and has been in and out of hospitals since 2008-Aug-2009. Patient father died Jun 04, 2018, he continue to grieve his father. Patient has attempted to suicide 20-30 times by cutting, hanging etc. Patient has been living in [facility] for the past 52-month.   Patient pleasant during assessment. Patient orientated x4, spoke logically and denied homicidal ideations, denied auditory/visual hallucinations. Patient  Affect appropriate and pleasant and thought process relevant. 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 Anxiety   . Bipolar disorder ([facility]     Past Surgical History:  Procedure Laterality Date  . BACK SURGERY    . LUMBAR LAMINECTOMY/DECOMPRESSION MICRODISCECTOMY Right 06/16/2019   Procedure: RIGHT- SIDED LUMBAR FOUR TO FIVE MICRODISECTOMY;  Surgeon: DPhylliss Bob MD;  [facility]  Service: Orthopedics;  Laterality: Right;  . NO PAST SURGERIES     Family History:  Family History  Family history unknown: Yes   Family Psychiatric   History: Eval Social History:  Social History   Substance and Sexual Activity  Alcohol Use Not Currently     Social History   Substance and Sexual Activity  Drug Use Not Currently    Social History   Socioeconomic History  . Marital status: Single    Spouse name: Not on file  . Number of children: Not on file  . Years of education: Not on file  . Highest education level: Not on file  Occupational History  . Not on file  Tobacco Use  . Smoking status: Current Every Day Smoker    Packs/day: 1.00    Years: 10.00    Pack years: 10.00    Types: Cigarettes  . Smokeless tobacco: Never Used  Substance and Sexual Activity  . Alcohol use: Not Currently  . Drug use: Not Currently  . Sexual activity: Not on file  Other Topics Concern  . Not on file  Social History Narrative  . Not on file   Social Determinants of Health   Financial Resource Strain:   . Difficulty of Paying Living Expenses: Not on file  Food Insecurity:   . Worried About RCharity fundraiserin the Last Year: Not on file  . Ran Out of Food in the Last Year: Not on file  Transportation Needs:   . Lack of Transportation (Medical): Not on file  . Lack of Transportation (Non-Medical): Not on file  Physical Activity:   . Days of Exercise per Week: Not on file  . Minutes of Exercise per Session: Not on file  Stress:   . Feeling of Stress : Not on file  Social Connections:   .  Frequency of Communication with Friends and Family: Not on file  . Frequency of Social Gatherings with Friends and Family: Not on file  . Attends Religious Services: Not on file  . Active Member of Clubs or Organizations: Not on file  . Attends Archivist Meetings: Not on file  . Marital Status: Not on file    Hospital Course:    Patient was admitted to observation he displayed no danger behaviors was monitored overnight, stated he felt he had the adequate medications and did not want medication adjustment.  By the morning  of the 10th since he did not want medication adjustments that was all we could really offer him in the hospital I asked him about discharge.  He gave me contradictory information about housing but I believe he is actually homeless at this point in time at any rate he denies current suicidal thoughts stated that he could contract and follow-up as an outpatient so he no longer met the criteria for inpatient care.  It appears that there were issues of secondary gain propelling his presentation but since he does not want to adjust his medications, because he can contract and states he is always had suicidal thoughts and they never go away, I do not see the point of the hospitalization at this point in time physically since he is contracting for safety  Physical Findings: AIMS: Facial and Oral Movements Muscles of Facial Expression: None, normal Lips and Perioral Area: None, normal Jaw: None, normal Tongue: None, normal,Extremity Movements Upper (arms, wrists, hands, fingers): None, normal Lower (legs, knees, ankles, toes): None, normal, Trunk Movements Neck, shoulders, hips: None, normal, Overall Severity Severity of abnormal movements (highest score from questions above): None, normal Incapacitation due to abnormal movements: None, normal Patient's awareness of abnormal movements (rate only patient's report): No Awareness, Dental Status Current problems with teeth and/or dentures?: No Does patient usually wear dentures?: No  CIWA:  CIWA-Ar Total: 6 COWS:  COWS Total Score: 3  Musculoskeletal: Strength & Muscle Tone: within normal limits Gait & Station: normal Patient leans: N/A  Psychiatric Specialty Exam: Physical Exam  Review of Systems  Blood pressure 126/86, pulse (!) 112, temperature 97.8 F (36.6 C), temperature source Oral, resp. rate 18, height _0  (1.803 m), weight 125.2 kg, SpO2 98 %.Body mass index is 38.49 kg/m.  General Appearance: Casual  Eye Contact:  Good  Speech:   Clear and Coherent  Volume:  Normal  Mood:  Euthymic  Affect:  Congruent  Thought Process:  Coherent and Goal Directed  Orientation:  Full (Time, Place, and Person)  Thought Content:  Logical  Suicidal Thoughts:  Yes.  without intent/plan  Homicidal Thoughts:  No  Memory:  Immediate;   Fair Recent;   Fair Remote;   Fair  Judgement:  Fair  Insight:  Fair  Psychomotor Activity:  Normal  Concentration:  Concentration: Fair and Attention Span: Fair  Recall:  [employer] of Knowledge:  Fair  Language:  Fair  Akathisia:  Negative  Handed:  Right  AIMS (if indicated):     Assets:  Social Support Talents/Skills  ADL's:  Intact  Cognition:  WNL  Sleep:        Have you used any form of tobacco in the last 30 days? (Cigarettes, Smokeless Tobacco, Cigars, and/or Pipes): Yes  Has this patient used any form of tobacco in the last 30 days? (Cigarettes, Smokeless Tobacco, Cigars, and/or Pipes) Yes, N/A  Blood Alcohol level:  Lab Results  Component Value Date   ETH <10 08/04/2019   ETH <10 46/96/2952    Metabolic Disorder Labs:  Lab Results  Component Value Date   HGBA1C 5.6 04/27/2019   MPG 114.02 04/27/2019   No results found for: PROLACTIN Lab Results  Component Value Date   CHOL 133 04/27/2019   TRIG 108 04/27/2019   HDL 31 (L) 04/27/2019   CHOLHDL 4.3 04/27/2019   VLDL 22 04/27/2019   [place] 80 04/27/2019    See Psychiatric Specialty Exam and Suicide Risk Assessment completed by Attending Physician prior to discharge.  Discharge destination:  Home  Is patient on multiple antipsychotic therapies at discharge:  No   Has Patient had three or more failed trials of antipsychotic monotherapy by history:  No  Recommended Plan for Multiple Antipsychotic Therapies: NA   Allergies as of 08/05/2019   No Known Allergies     Medication List    STOP taking these medications   acetaminophen 325 MG tablet Commonly known as: TYLENOL     TAKE these medications      Indication  ARIPiprazole 10 MG tablet Commonly known as: ABILIFY Take 1 tablet (10 mg total) by mouth at bedtime. For mood control  Indication: Mood control   buPROPion 150 MG 24 hr tablet Commonly known as: WELLBUTRIN XL Take 1 tablet (150 mg total) by mouth daily. For depression  Indication: Major Depressive Disorder   carbamide peroxide 6.5 % OTIC solution Commonly known as: DEBROX Place 5 drops into the left ear 2 (two) times daily. For ear wax removal  Indication: Removal of Wax From the Ear   lithium carbonate 300 MG CR tablet Commonly known as: LITHOBID Take 600 mg by mouth 2 (two) times daily.  Indication: Manic-Depression   Lyrica 75 MG capsule Generic drug: pregabalin Take 75 mg by mouth 2 (two) times daily.  Indication: Neuropathic Pain   methocarbamol 500 MG tablet Commonly known as: ROBAXIN Take 1 tablet (500 mg total) by mouth every 6 (six) hours as needed for muscle spasms. What changed: Another medication with the same name was removed. Continue taking this medication, and follow the directions you see here.  Indication: Musculoskeletal Pain   nicotine polacrilex 2 MG gum Commonly known as: NICORETTE Take 1 each (2 mg total) by mouth as needed. For smoking cessation  Indication: Nicotine Addiction   traZODone 50 MG tablet Commonly known as: DESYREL Take 1 tablet (50 mg total) by mouth at bedtime as needed for sleep.  Indication: Trouble Sleeping       SignedJohnn Hai, MD 08/05/2019, 11:11 AM

## 2019-08-05 NOTE — Discharge Instructions (Signed)
For your behavioral health needs you are advised to follow up with [facility].  New patients are seen at their walk-in clinic.  Walk-in hours are Monday - Friday from 8:30 am - 12:00 pm, and from 1:00 pm - 2:30 pm.  Walk-in patients are seen on a first come, first served basis, so try to arrive as early as possible for the best chance of being seen the same day:       [facility]      [address]      [place], [place] [address]      [phone] 

## 2019-08-05 NOTE — Progress Notes (Signed)
Patient discharge home. AVS/Follow-Up reviewed with patient and he verbalized understanding and written copies given to patient. Patient denies SI/HI and A/V hallucinations at discharge. All patient belongings returned to him at discharge. Patient escorted off  Unit to catch bus at discharge.

## 2019-08-05 NOTE — BH Assessment (Signed)
[facility] Assessment Progress Note  Per Malvin Johns, MD, this pt does not require psychiatric hospitalization at this time.  Pt is to be discharged from the [facility] Observation Unit with recommendation to follow up with [facility].  This has been included in pt's discharge instructions.  Pt's nurse, Britta Mccreedy, has been notified.  Doylene Canning, MA Triage Specialist [phone]

## 2019-08-05 NOTE — Progress Notes (Signed)
Patient alert, oriented and ambulatory. Patient denies SI/HI and A/V hallucinations. Patient is in room lying on bed with eyes open. Q 15 minute checks in progress and patient remains safe on unit. Monitoring continues.

## 2020-02-15 IMAGING — CR DG ORBITS FOR FOREIGN BODY
2 series · 2 of 2 positions shown · non-contrast
Comparison: None.

CLINICAL DATA: History of metal in eyes.  Assess for MR safety.

EXAM:
ORBITS FOR FOREIGN BODY - 2 VIEW

[w orbit pa (1 of 2)]
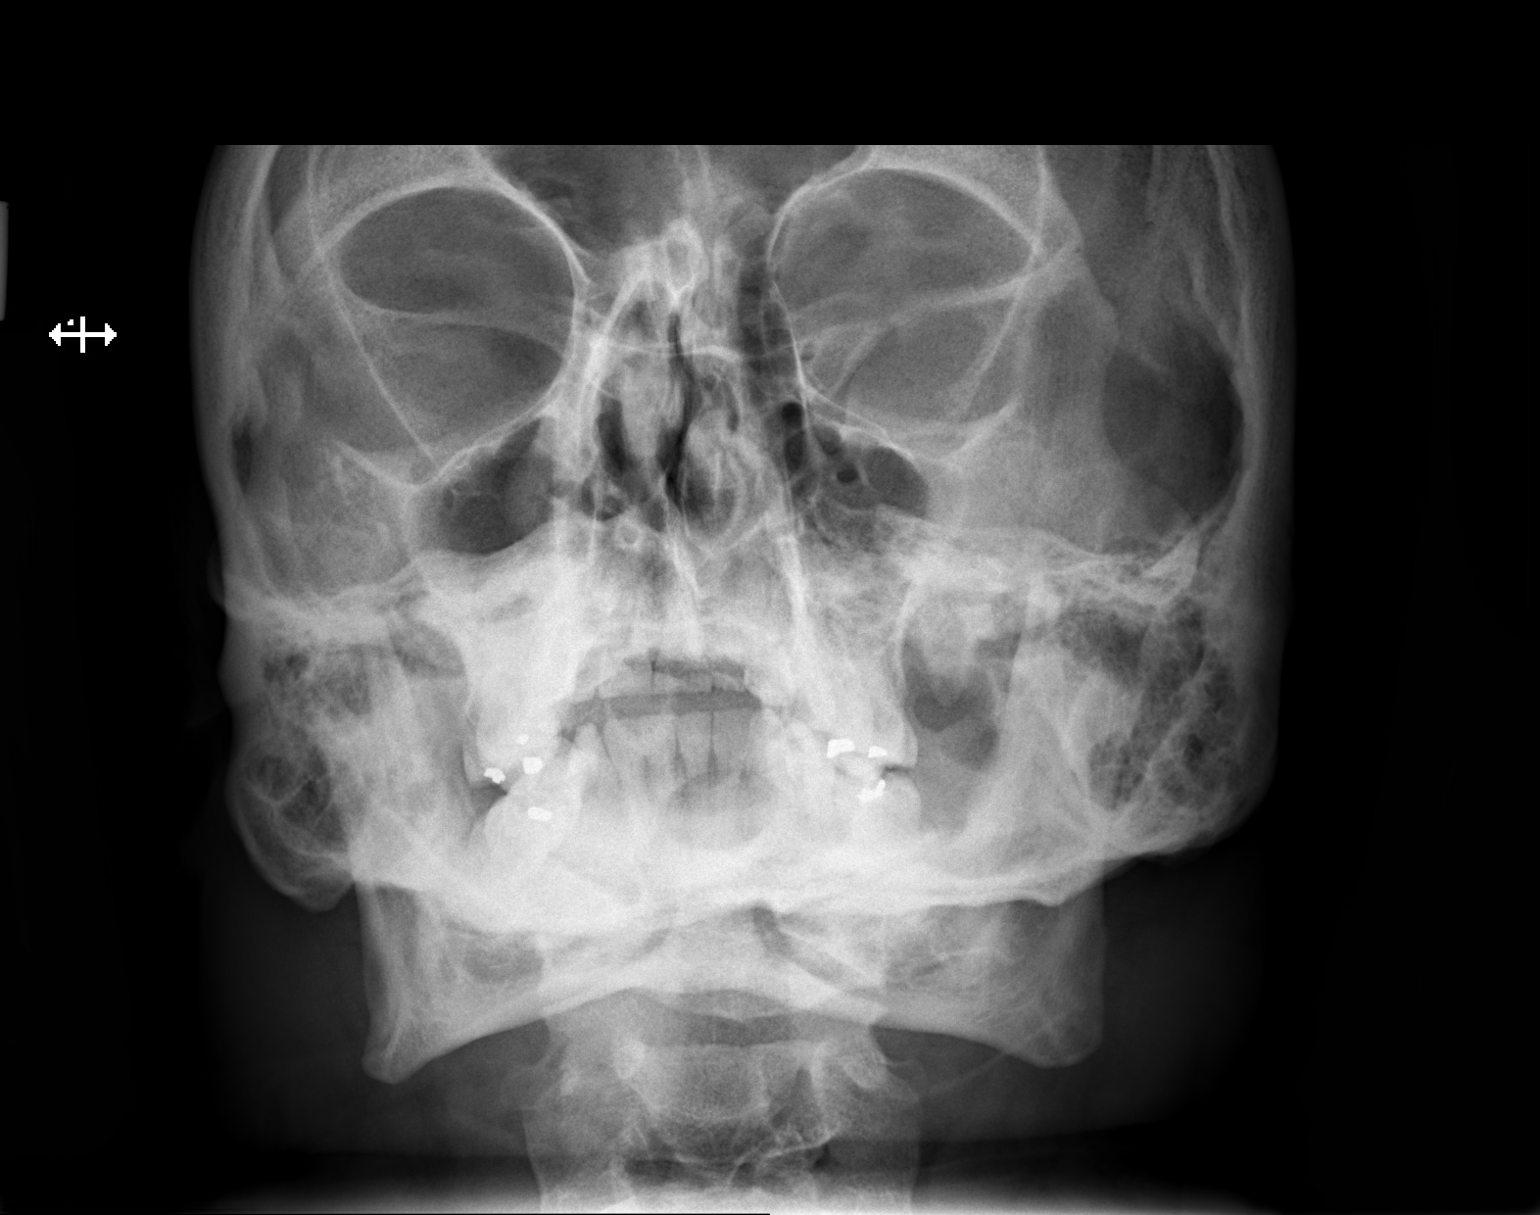

[w orbit pa (2 of 2)]
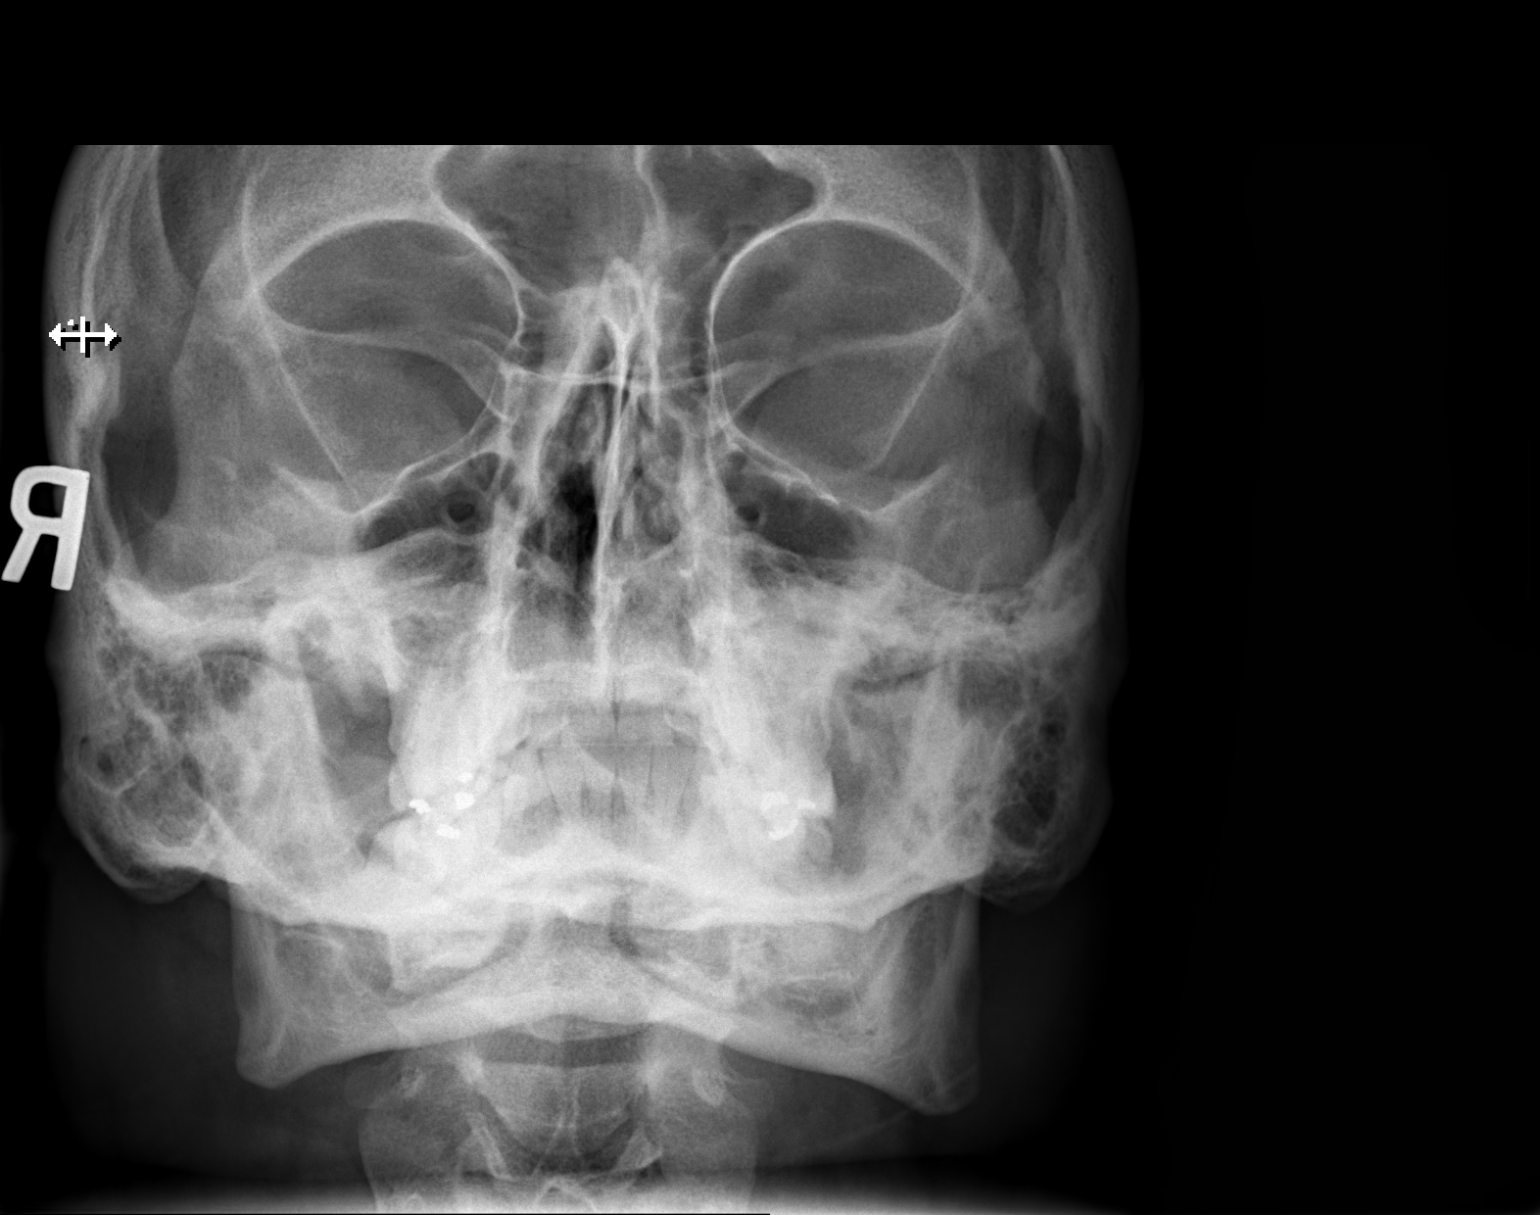

[2 of 2 positions shown; findings below may reference images not displayed]

FINDINGS: There is no evidence of metallic foreign body within the orbits. No
significant bone abnormality identified.
IMPRESSION: No evidence of metallic foreign body within the orbits.

## 2020-03-30 IMAGING — CR DG LUMBAR SPINE 2-3V
2 series · 2 of 2 positions shown · non-contrast
Comparison: None.

CLINICAL DATA: Lumbar microdiscectomy.

EXAM:
LUMBAR SPINE - 2-3 VIEW

[lateral (1 of 2)]
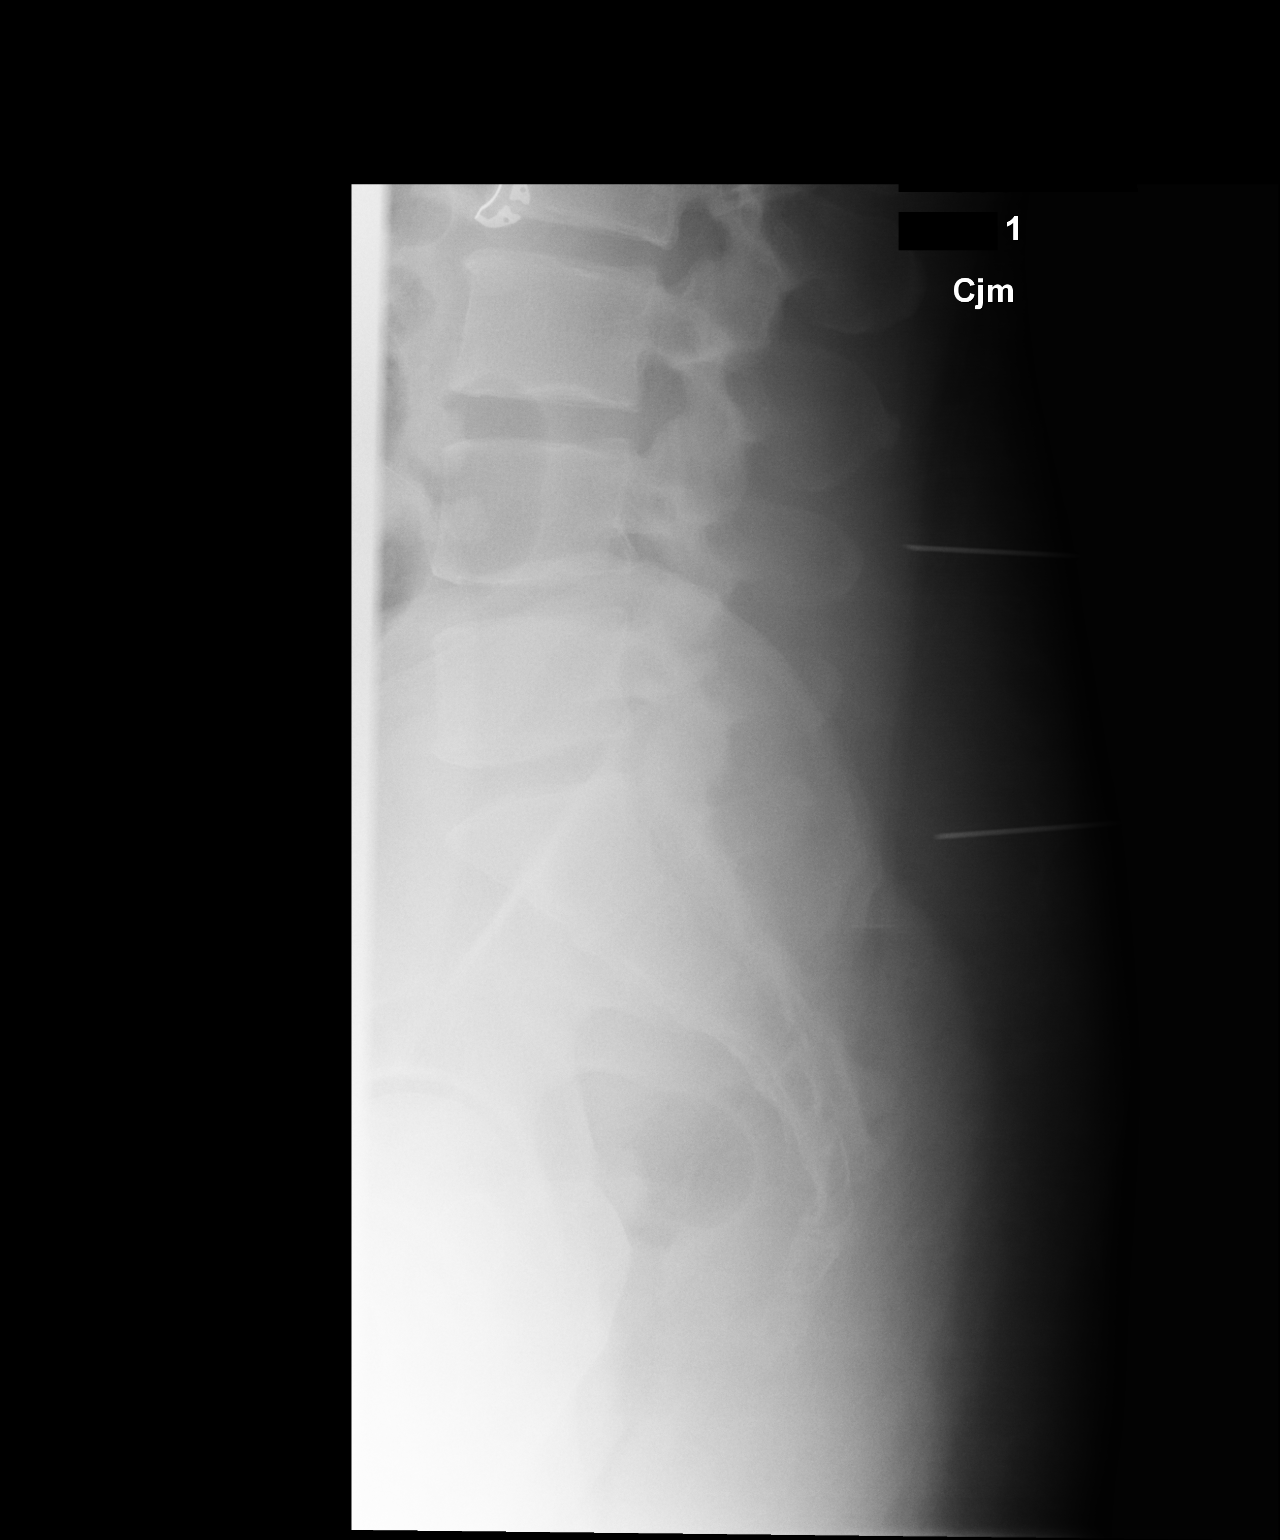

[lateral (2 of 2)]
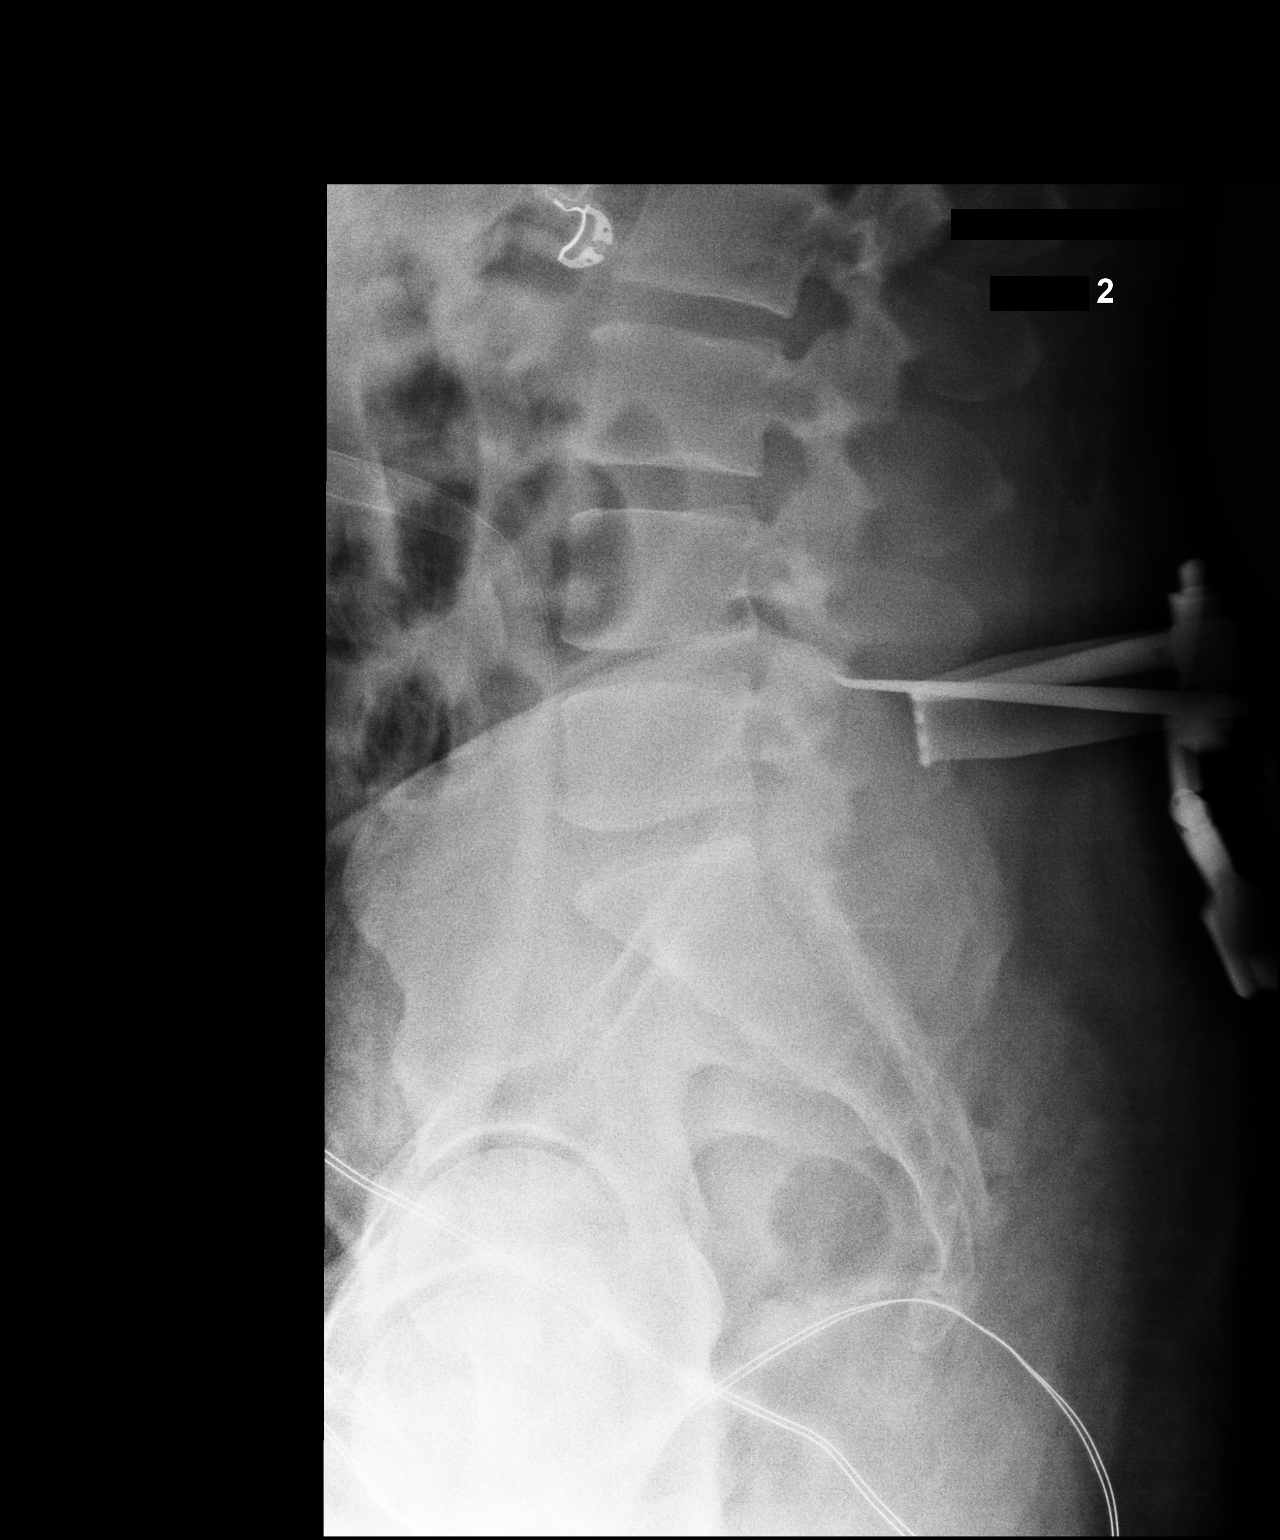

[2 of 2 positions shown; findings below may reference images not displayed]

FINDINGS: There is no evidence of lumbar spine fracture. Alignment is normal.
A radiopaque forceps and surgical tools are seen overlying the
posterior elements at the level of L4-L5. Intervertebral disc spaces
are maintained.
IMPRESSION: No evidence of lumbar spine fracture or subluxation.
